# Patient Record
Sex: Male | Born: 1960 | Race: White | Hispanic: No | Marital: Married | Smoking: Former smoker
Health system: Southern US, Community
[De-identification: ages and names within clinical notes are randomized; demographics above are authoritative.]

## PROBLEM LIST (undated history)

## (undated) DIAGNOSIS — I10 Essential (primary) hypertension: Secondary | ICD-10-CM

## (undated) DIAGNOSIS — R011 Cardiac murmur, unspecified: Secondary | ICD-10-CM

## (undated) HISTORY — PX: TONSILLECTOMY: SUR1361

---

## 2005-07-21 ENCOUNTER — Emergency Department: Payer: Self-pay | Admitting: General Practice

## 2006-03-30 ENCOUNTER — Emergency Department: Payer: Self-pay | Admitting: Emergency Medicine

## 2006-10-10 ENCOUNTER — Emergency Department: Payer: Self-pay | Admitting: Emergency Medicine

## 2006-10-10 ENCOUNTER — Other Ambulatory Visit: Payer: Self-pay

## 2007-09-21 ENCOUNTER — Emergency Department: Payer: Self-pay | Admitting: Emergency Medicine

## 2007-09-21 ENCOUNTER — Other Ambulatory Visit: Payer: Self-pay

## 2008-07-13 ENCOUNTER — Emergency Department: Payer: Self-pay | Admitting: Emergency Medicine

## 2008-10-31 ENCOUNTER — Emergency Department: Payer: Self-pay | Admitting: Emergency Medicine

## 2009-06-13 ENCOUNTER — Emergency Department: Payer: Self-pay | Admitting: Internal Medicine

## 2010-09-08 ENCOUNTER — Emergency Department: Payer: Self-pay | Admitting: Emergency Medicine

## 2011-07-22 ENCOUNTER — Emergency Department: Payer: Self-pay | Admitting: Internal Medicine

## 2012-02-14 ENCOUNTER — Emergency Department: Payer: Self-pay | Admitting: Emergency Medicine

## 2012-10-19 ENCOUNTER — Emergency Department: Payer: Self-pay | Admitting: Emergency Medicine

## 2013-06-09 ENCOUNTER — Emergency Department: Payer: Self-pay | Admitting: Emergency Medicine

## 2013-06-12 LAB — BETA STREP CULTURE(ARMC)

## 2013-07-04 ENCOUNTER — Emergency Department: Payer: Self-pay | Admitting: Emergency Medicine

## 2013-07-04 LAB — COMPREHENSIVE METABOLIC PANEL
ALK PHOS: 81 U/L
Albumin: 4 g/dL (ref 3.4–5.0)
Anion Gap: 5 — ABNORMAL LOW (ref 7–16)
BILIRUBIN TOTAL: 0.3 mg/dL (ref 0.2–1.0)
BUN: 28 mg/dL — AB (ref 7–18)
CREATININE: 1.18 mg/dL (ref 0.60–1.30)
Calcium, Total: 8.8 mg/dL (ref 8.5–10.1)
Chloride: 107 mmol/L (ref 98–107)
Co2: 27 mmol/L (ref 21–32)
EGFR (Non-African Amer.): >60
Glucose: 95 mg/dL (ref 65–99)
OSMOLALITY: 283 (ref 275–301)
Potassium: 3.8 mmol/L (ref 3.5–5.1)
SGOT(AST): 36 U/L (ref 15–37)
SGPT (ALT): 39 U/L (ref 12–78)
SODIUM: 139 mmol/L (ref 136–145)
Total Protein: 7.6 g/dL (ref 6.4–8.2)

## 2013-07-04 LAB — CBC WITH DIFFERENTIAL/PLATELET
BASOS PCT: 0.4 %
Basophil #: 0 10*3/uL (ref 0.0–0.1)
Eosinophil #: 0.1 10*3/uL (ref 0.0–0.7)
Eosinophil %: 1.6 %
HCT: 39 % — AB (ref 40.0–52.0)
HGB: 13.4 g/dL (ref 13.0–18.0)
Lymphocyte #: 1.7 10*3/uL (ref 1.0–3.6)
Lymphocyte %: 29.6 %
MCH: 30.4 pg (ref 26.0–34.0)
MCHC: 34.4 g/dL (ref 32.0–36.0)
MCV: 89 fL (ref 80–100)
MONO ABS: 0.4 x10 3/mm (ref 0.2–1.0)
Monocyte %: 6.5 %
Neutrophil #: 3.5 10*3/uL (ref 1.4–6.5)
Neutrophil %: 61.9 %
Platelet: 170 10*3/uL (ref 150–440)
RBC: 4.41 10*6/uL (ref 4.40–5.90)
RDW: 13.9 % (ref 11.5–14.5)
WBC: 5.7 10*3/uL (ref 3.8–10.6)

## 2013-07-04 LAB — TROPONIN I: Troponin-I: <0.02 ng/mL

## 2013-07-04 LAB — D-DIMER(ARMC): D-Dimer: 236 ng/ml

## 2013-08-04 ENCOUNTER — Emergency Department: Payer: Self-pay | Admitting: Emergency Medicine

## 2013-09-04 ENCOUNTER — Emergency Department: Payer: Self-pay | Admitting: Emergency Medicine

## 2014-06-13 ENCOUNTER — Encounter: Payer: Self-pay | Admitting: Emergency Medicine

## 2014-06-13 ENCOUNTER — Emergency Department
Admission: EM | Admit: 2014-06-13 | Discharge: 2014-06-13 | Disposition: A | Discharge status: Home / Self Care | Payer: Self-pay | Attending: Student | Admitting: Student

## 2014-06-13 DIAGNOSIS — J0100 Acute maxillary sinusitis, unspecified: Secondary | ICD-10-CM | POA: Insufficient documentation

## 2014-06-13 DIAGNOSIS — I10 Essential (primary) hypertension: Secondary | ICD-10-CM | POA: Insufficient documentation

## 2014-06-13 DIAGNOSIS — Z79899 Other long term (current) drug therapy: Secondary | ICD-10-CM | POA: Insufficient documentation

## 2014-06-13 HISTORY — DX: Essential (primary) hypertension: I10

## 2014-06-13 MED ORDER — IBUPROFEN 800 MG PO TABS: 800.0000 mg | ORAL_TABLET | Freq: Three times a day (TID) | ORAL | Status: DC | PRN

## 2014-06-13 MED ORDER — AMOXICILLIN-POT CLAVULANATE 875-125 MG PO TABS: 1.0000 | ORAL_TABLET | Freq: Two times a day (BID) | ORAL | Status: AC

## 2014-06-13 NOTE — ED Notes (Signed)
Headache since Sunday  Sore throat and body aches this week

## 2014-06-13 NOTE — ED Notes (Signed)
C/o head sinus chest congestion, hurts to swallow past few days, back of throat slightly red

## 2014-06-13 NOTE — Discharge Instructions (Signed)
Take medications as prescribed. Rest. Drink plenty of fluids. Take over-the-counter Tylenol or ibuprofen as needed. Follow up with primary care physician next week or the above.  Return to the ER for new or worsening concerns.  Sinusitis Sinusitis is redness, soreness, and puffiness (inflammation) of the air pockets in the bones of your face (sinuses). The redness, soreness, and puffiness can cause air and mucus to get trapped in your sinuses. This can allow germs to grow and cause an infection.  HOME CARE   Drink enough fluids to keep your pee (urine) clear or pale yellow.  Use a humidifier in your home.  Run a hot shower to create steam in the bathroom. Sit in the bathroom with the door closed. Breathe in the steam 3-4 times a day.  Put a warm, moist washcloth on your face 3-4 times a day, or as told by your doctor.  Use salt water sprays (saline sprays) to wet the thick fluid in your nose. This can help the sinuses drain.  Only take medicine as told by your doctor. GET HELP RIGHT AWAY IF:   Your pain gets worse.  You have very bad headaches.  You are sick to your stomach (nauseous).  You throw up (vomit).  You are very sleepy (drowsy) all the time.  Your face is puffy (swollen).  Your vision changes.  You have a stiff neck.  You have trouble breathing. MAKE SURE YOU:   Understand these instructions.  Will watch your condition.  Will get help right away if you are not doing well or get worse. Document Released: 07/07/2007 Document Revised: 10/13/2011 Document Reviewed: 08/24/2011 [facility] Patient Information 2015 [employer] [place]. This information is not intended to replace advice given to you by your health care provider. Make sure you discuss any questions you have with your health care provider.

## 2014-06-13 NOTE — ED Provider Notes (Signed)
[facility] Emergency Department Provider Note  ____________________________________________  Time seen: Approximately [phone] AM  I have reviewed the triage vital signs and the nursing notes.   HISTORY  Chief Complaint Sore Throat   HPI Jaime Cook is a 54 y.o. male presents to the ER with complaints of sinus congestion and sinus pain. Patient reports congestion for one week. Patient reports increased sinus congestion and pain 5 days. Reports sore throat and throat redness 4 days. States throat feels raw and hurts to swallow. Denies chest pain or shortness of breath. Reports chills, nasal drainage, and cough but only at night.States sinus pain is 4 out of 10 described as pressure. States unrelieved with Tylenol.  Reports continues to eat and drink well. Past Medical History  Diagnosis Date  . Hypertension     There are no active problems to display for this patient.   History reviewed. No pertinent past surgical history.  Current Outpatient Rx  Name  Route  Sig  Dispense  Refill  . hydrochlorothiazide (HYDRODIURIL) 25 MG tablet   Oral   Take 25 mg by mouth daily.           Allergies Review of patient's allergies indicates no known allergies.  History reviewed. No pertinent family history.  Social History History  Substance Use Topics  . Smoking status: Never Smoker   . Smokeless tobacco: Not on file  . Alcohol Use: No    Review of Systems Constitutional:chills Eyes: No visual changes. ENT: Positive for congestion, sore throat, sinus pain as above. Cardiovascular: Denies chest pain. Respiratory: Positive for cough. Denies shortness of breath. Gastrointestinal: No abdominal pain.  No nausea, no vomiting.  No diarrhea.  No constipation. Genitourinary: Negative for dysuria. Musculoskeletal: Negative for back pain. Skin: Negative for rash. Neurological: Negative for headaches, focal weakness or numbness.  10-point ROS otherwise  negative.  ____________________________________________   PHYSICAL EXAM:  VITAL SIGNS: ED Triage Vitals  Enc Vitals Group     BP 06/13/14 0814 129/65 mmHg     Pulse Rate 06/13/14 0814 64     Resp 06/13/14 0814 18     Temp 06/13/14 0814 98 F (36.7 C)     Temp Source 06/13/14 0814 Oral     SpO2 06/13/14 0814 100 %     Weight 06/13/14 0814 220 lb (99.791 kg)     Height 06/13/14 0814 6' (1.829 m)     Head Cir --      Peak Flow --      Pain Score 06/13/14 0812 7     Pain Loc --      Pain Edu? --      Excl. in GC? --     Constitutional: Alert and oriented. Well appearing and in no acute distress. Eyes: Conjunctivae are normal. PERRL. EOMI. Head: Atraumatic. Moderate tender to palpation at maxillary sinus. Mild tender to palpation frontal sinus tenderness. No erythema, induration, or swelling. Nose:  nasal congestion with nasal mucosal inflammation. rhinorrhea Mouth/Throat: Mucous membranes are moist.  Mild-to-moderate pharyngeal erythema. No tonsillar swelling or exudate. Neck: No stridor. No cervical spine tenderness to palpation. No pain with range of motion. Hematological/Lymphatic/Immunilogical: No cervical lymphadenopathy. Cardiovascular: Normal rate, regular rhythm. Grossly normal heart sounds.  Good peripheral circulation. Respiratory: Normal respiratory effort.  No retractions. Lungs CTAB. Gastrointestinal: Soft and nontender. No distention. No abdominal bruits. No CVA tenderness. Musculoskeletal: No lower extremity tenderness nor edema.  No joint effusions. Neurologic:  Normal speech and language. No gross focal  neurologic deficits are appreciated. Speech is normal. No gait instability. Skin:  Skin is warm, dry and intact. No rash noted. Psychiatric: Mood and affect are normal. Speech and behavior are normal.   ________________________________________   INITIAL IMPRESSION / ASSESSMENT AND PLAN / ED COURSE  Pertinent labs & imaging results that were available during  my care of the patient were reviewed by me and considered in my medical decision making (see chart for details).  Very well-appearing. Patient reports to ER with sinus pain, congestion x 7 days with sore throat times approximate 4 days. Will treat patient with Augmentin and when necessary ibuprofen. Discussed supportive treatment. Discussed fluids and rest. Patient to follow up with primary care physician next week. Discussed follow-up and return parameters. Patient agreed to plan. ____________________________________________   FINAL CLINICAL IMPRESSION(S) / ED DIAGNOSES  Final diagnoses:  Acute maxillary sinusitis, recurrence not specified      Renford DillsLindsey Marlen Koman, NP 06/13/14 1033  Gayla DossEryka A Gayle, MD 06/13/14 1456

## 2014-08-19 ENCOUNTER — Emergency Department
Admission: EM | Admit: 2014-08-19 | Discharge: 2014-08-19 | Disposition: A | Discharge status: Home / Self Care | Payer: No Typology Code available for payment source | Attending: Emergency Medicine | Admitting: Emergency Medicine

## 2014-08-19 ENCOUNTER — Encounter: Payer: Self-pay | Admitting: Emergency Medicine

## 2014-08-19 ENCOUNTER — Emergency Department: Payer: No Typology Code available for payment source

## 2014-08-19 DIAGNOSIS — I10 Essential (primary) hypertension: Secondary | ICD-10-CM | POA: Diagnosis not present

## 2014-08-19 DIAGNOSIS — Y9389 Activity, other specified: Secondary | ICD-10-CM | POA: Insufficient documentation

## 2014-08-19 DIAGNOSIS — Y998 Other external cause status: Secondary | ICD-10-CM | POA: Diagnosis not present

## 2014-08-19 DIAGNOSIS — M47893 Other spondylosis, cervicothoracic region: Secondary | ICD-10-CM | POA: Insufficient documentation

## 2014-08-19 DIAGNOSIS — Z79899 Other long term (current) drug therapy: Secondary | ICD-10-CM | POA: Insufficient documentation

## 2014-08-19 DIAGNOSIS — Y92410 Unspecified street and highway as the place of occurrence of the external cause: Secondary | ICD-10-CM | POA: Insufficient documentation

## 2014-08-19 DIAGNOSIS — M5090 Cervical disc disorder, unspecified, unspecified cervical region: Secondary | ICD-10-CM

## 2014-08-19 DIAGNOSIS — S199XXA Unspecified injury of neck, initial encounter: Secondary | ICD-10-CM | POA: Diagnosis present

## 2014-08-19 DIAGNOSIS — S134XXA Sprain of ligaments of cervical spine, initial encounter: Secondary | ICD-10-CM | POA: Diagnosis not present

## 2014-08-19 MED ORDER — IBUPROFEN 800 MG PO TABS
800.0000 mg | ORAL_TABLET | Freq: Once | ORAL | Status: AC
  Administered 2014-08-19: 800 mg via ORAL

## 2014-08-19 MED ORDER — IBUPROFEN 800 MG PO TABS
ORAL_TABLET | ORAL | Status: AC
  Administered 2014-08-19: 800 mg via ORAL
  Filled 2014-08-19: qty 1

## 2014-08-19 MED ORDER — CYCLOBENZAPRINE HCL 10 MG PO TABS: 10.0000 mg | ORAL_TABLET | Freq: Three times a day (TID) | ORAL | Status: AC | PRN

## 2014-08-19 NOTE — Discharge Instructions (Signed)

## 2014-08-19 NOTE — ED Notes (Signed)
Dr. Manson PasseyBrown returns to bedside to speak with patient regarding results and discharge plan of care.
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
## 2014-08-19 NOTE — ED Notes (Signed)
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
## 2015-03-13 ENCOUNTER — Encounter: Payer: Self-pay | Admitting: Emergency Medicine

## 2015-03-13 ENCOUNTER — Emergency Department
Admission: EM | Admit: 2015-03-13 | Discharge: 2015-03-13 | Disposition: A | Discharge status: Home / Self Care | Payer: No Typology Code available for payment source | Attending: Emergency Medicine | Admitting: Emergency Medicine

## 2015-03-13 DIAGNOSIS — Z79899 Other long term (current) drug therapy: Secondary | ICD-10-CM | POA: Insufficient documentation

## 2015-03-13 DIAGNOSIS — H9203 Otalgia, bilateral: Secondary | ICD-10-CM | POA: Insufficient documentation

## 2015-03-13 DIAGNOSIS — J069 Acute upper respiratory infection, unspecified: Secondary | ICD-10-CM | POA: Insufficient documentation

## 2015-03-13 DIAGNOSIS — I10 Essential (primary) hypertension: Secondary | ICD-10-CM | POA: Insufficient documentation

## 2015-03-13 MED ORDER — GUAIFENESIN-CODEINE 100-10 MG/5ML PO SOLN: 5.0000 mL | ORAL | Status: DC | PRN

## 2015-03-13 NOTE — ED Notes (Signed)
States he developed sinus pressure  Bilateral ear pain and headache sine yesterday  Took some OTC meds with med relief last pm

## 2015-03-13 NOTE — ED Notes (Signed)
Pt reports of bilateral ear pain, headache and URI symptoms since yesterday.

## 2015-03-13 NOTE — Discharge Instructions (Signed)
Upper Respiratory Infection, Adult Most upper respiratory infections (URIs) are caused by a virus. A URI affects the nose, throat, and upper air passages. The most common type of URI is often called "the common cold." HOME CARE   Take medicines only as told by your doctor.  Gargle warm saltwater or take cough drops to comfort your throat as told by your doctor.  Use a warm mist humidifier or inhale steam from a shower to increase air moisture. This may make it easier to breathe.  Drink enough fluid to keep your pee (urine) clear or pale yellow.  Eat soups and other clear broths.  Have a healthy diet.  Rest as needed.  Go back to work when your fever is gone or your doctor says it is okay.  You may need to stay home longer to avoid giving your URI to others.  You can also wear a face mask and wash your hands often to prevent spread of the virus.  Use your inhaler more if you have asthma.  Do not use any tobacco products, including cigarettes, chewing tobacco, or electronic cigarettes. If you need help quitting, ask your doctor. GET HELP IF:  You are getting worse, not better.  Your symptoms are not helped by medicine.  You have chills.  You are getting more short of breath.  You have brown or red mucus.  You have yellow or brown discharge from your nose.  You have pain in your face, especially when you bend forward.  You have a fever.  You have puffy (swollen) neck glands.  You have pain while swallowing.  You have white areas in the back of your throat. GET HELP RIGHT AWAY IF:   You have very bad or constant:  Headache.  Ear pain.  Pain in your forehead, behind your eyes, and over your cheekbones (sinus pain).  Chest pain.  You have long-lasting (chronic) lung disease and any of the following:  Wheezing.  Long-lasting cough.  Coughing up blood.  A change in your usual mucus.  You have a stiff neck.  You have changes in  your:  Vision.  Hearing.  Thinking.  Mood. MAKE SURE YOU:   Understand these instructions.  Will watch your condition.  Will get help right away if you are not doing well or get worse.   This information is not intended to replace advice given to you by your health care provider. Make sure you discuss any questions you have with your health care provider.   Document Released: 07/07/2007 Document Revised: 06/04/2014 Document Reviewed: 04/25/2013 Elsevier Interactive Patient Education 2016 [employer]   Tylenol or ibuprofen as needed for headache or muscle aches. Robitussin-AC for cough and congestion. Be aware that this medication can make you drowsy so you should not be driving while taking this medication. Increase fluids.

## 2015-03-13 NOTE — ED Provider Notes (Signed)
[facility] Emergency Department Provider Note ____________________________________________  Time seen: Approximately 7:17 AM  I have reviewed the triage vital signs and the nursing notes.   HISTORY  Chief Complaint URI and Otalgia  HPI COPPER BASNETT is a 55 y.o. male is herewith complaint of sinus pressure and bilateral ear pain. Patient states that she has trace had a headache and nasal congestion. He has had some cough. He is taken Tylenol with minimal relief of symptoms. He is unaware of any fever but "felt chills". He denies any nausea, vomiting, or diarrhea. Patient is a nonsmoker. Currently rates his pain is 7 out of 10.   Past Medical History  Diagnosis Date  . Hypertension     There are no active problems to display for this patient.   History reviewed. No pertinent past surgical history.  Current Outpatient Rx  Name  Route  Sig  Dispense  Refill  . cyclobenzaprine (FLEXERIL) 10 MG tablet   Oral   Take 1 tablet (10 mg total) by mouth every 8 (eight) hours as needed for muscle spasms.   30 tablet   1   . guaiFENesin-codeine 100-10 MG/5ML syrup   Oral   Take 5 mLs by mouth every 4 (four) hours as needed for cough.   120 mL   0   . hydrochlorothiazide (HYDRODIURIL) 25 MG tablet   Oral   Take 25 mg by mouth daily.         Marland Kitchen ibuprofen (ADVIL,MOTRIN) 800 MG tablet   Oral   Take 1 tablet (800 mg total) by mouth every 8 (eight) hours as needed for mild pain or moderate pain.   15 tablet   0     Allergies Review of patient's allergies indicates no known allergies.  No family history on file.  Social History Social History  Substance Use Topics  . Smoking status: Never Smoker   . Smokeless tobacco: None  . Alcohol Use: No    Review of Systems Constitutional: No fever/possible chills Eyes: No visual changes. ENT: No sore throat. Positive nasal congestion Cardiovascular: Denies chest pain. Respiratory: Denies shortness of  breath. Positive nonproductive cough Gastrointestinal: No abdominal pain.  No nausea, no vomiting.  No diarrhea.  Skin: Negative for rash. Neurological: Positive for headaches, no focal weakness or numbness.  10-point ROS otherwise negative.  ____________________________________________   PHYSICAL EXAM:  VITAL SIGNS: ED Triage Vitals  Enc Vitals Group     BP 03/13/15 0709 138/70 mmHg     Pulse Rate 03/13/15 0709 81     Resp 03/13/15 0709 20     Temp 03/13/15 0709 97.7 F (36.5 C)     Temp Source 03/13/15 0709 Oral     SpO2 03/13/15 0709 97 %     Weight 03/13/15 0709 220 lb (99.791 kg)     Height 03/13/15 0709 6' (1.829 m)     Head Cir --      Peak Flow --      Pain Score 03/13/15 0709 7     Pain Loc --      Pain Edu? --      Excl. in GC? --     Constitutional: Alert and oriented. Well appearing and in no acute distress. Eyes: Conjunctivae are normal. PERRL. EOMI. Head: Atraumatic. Nose: Moderate congestion/rhinnorhea.  TMs are dull bilaterally with poor light reflex. Mouth/Throat: Mucous membranes are moist.  Oropharynx non-erythematous. Mild posterior drainage. Neck: No stridor.  Supple Hematological/Lymphatic/Immunilogical: No cervical lymphadenopathy. Cardiovascular: Normal  rate, regular rhythm. Grossly normal heart sounds.  Good peripheral circulation. Respiratory: Normal respiratory effort.  No retractions. Lungs CTAB. Musculoskeletal: His upper and lower extremities without any difficulty. Normal gait was noted. Neurologic:  Normal speech and language. No gross focal neurologic deficits are appreciated. No gait instability. Skin:  Skin is warm, dry and intact. No rash noted. Psychiatric: Mood and affect are normal. Speech and behavior are normal.  ____________________________________________   LABS (all labs ordered are listed, but only abnormal results are displayed)  Labs Reviewed - No data to display  PROCEDURES  Procedure(s) performed: None  Critical  Care performed: No  ____________________________________________   INITIAL IMPRESSION / ASSESSMENT AND PLAN / ED COURSE  Pertinent labs & imaging results that were available during my care of the patient were reviewed by me and considered in my medical decision making (see chart for details).  Patient was given a prescription for guaifenesin with codeine cough syrup. Patient is to continue with Tylenol or ibuprofen if needed for aches and encouraged drink plenty of fluids. 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 ____________________________________________   FINAL CLINICAL IMPRESSION(S) / ED DIAGNOSES  Final diagnoses:  Acute upper respiratory infection      Tommi Rumps, PA-C 03/13/15 1610  Sharyn Creamer, MD 03/13/15 [phone]

## 2015-08-28 ENCOUNTER — Emergency Department
Admission: EM | Admit: 2015-08-28 | Discharge: 2015-08-28 | Disposition: A | Discharge status: Home / Self Care | Payer: Self-pay | Attending: Emergency Medicine | Admitting: Emergency Medicine

## 2015-08-28 ENCOUNTER — Emergency Department: Payer: Self-pay

## 2015-08-28 ENCOUNTER — Encounter: Payer: Self-pay | Admitting: Emergency Medicine

## 2015-08-28 DIAGNOSIS — R51 Headache: Secondary | ICD-10-CM | POA: Insufficient documentation

## 2015-08-28 DIAGNOSIS — I10 Essential (primary) hypertension: Secondary | ICD-10-CM | POA: Insufficient documentation

## 2015-08-28 DIAGNOSIS — R700 Elevated erythrocyte sedimentation rate: Secondary | ICD-10-CM | POA: Insufficient documentation

## 2015-08-28 DIAGNOSIS — Z79899 Other long term (current) drug therapy: Secondary | ICD-10-CM | POA: Insufficient documentation

## 2015-08-28 DIAGNOSIS — R519 Headache, unspecified: Secondary | ICD-10-CM

## 2015-08-28 LAB — BASIC METABOLIC PANEL
ANION GAP: 8 (ref 5–15)
BUN: 27 mg/dL — AB (ref 6–20)
CALCIUM: 9.5 mg/dL (ref 8.9–10.3)
CO2: 27 mmol/L (ref 22–32)
Chloride: 104 mmol/L (ref 101–111)
Creatinine, Ser: 1.17 mg/dL (ref 0.61–1.24)
GFR calc Af Amer: >60 mL/min (ref >60)
Glucose, Bld: 102 mg/dL — ABNORMAL HIGH (ref 65–99)
POTASSIUM: 3.7 mmol/L (ref 3.5–5.1)
Sodium: 139 mmol/L (ref 135–145)

## 2015-08-28 LAB — CBC WITH DIFFERENTIAL/PLATELET
Basophils Absolute: 0 10*3/uL (ref 0–0.1)
Basophils Relative: 0 %
Eosinophils Absolute: 0.1 10*3/uL (ref 0–0.7)
Eosinophils Relative: 2 %
HEMATOCRIT: 40.3 % (ref 40.0–52.0)
Hemoglobin: 14.3 g/dL (ref 13.0–18.0)
Lymphocytes Relative: 33 %
Lymphs Abs: 1.9 10*3/uL (ref 1.0–3.6)
MCH: 30.9 pg (ref 26.0–34.0)
MCHC: 35.6 g/dL (ref 32.0–36.0)
MCV: 86.9 fL (ref 80.0–100.0)
Monocytes Absolute: 0.5 10*3/uL (ref 0.2–1.0)
Monocytes Relative: 8 %
Neutro Abs: 3.4 10*3/uL (ref 1.4–6.5)
Neutrophils Relative %: 57 %
Platelets: 166 10*3/uL (ref 150–440)
RBC: 4.64 MIL/uL (ref 4.40–5.90)
RDW: 13.5 % (ref 11.5–14.5)
WBC: 6 10*3/uL (ref 3.8–10.6)

## 2015-08-28 LAB — SEDIMENTATION RATE: Sed Rate: 21 mm/hr — ABNORMAL HIGH (ref 0–20)

## 2015-08-28 MED ORDER — KETOROLAC TROMETHAMINE 30 MG/ML IJ SOLN
30.0000 mg | Freq: Once | INTRAMUSCULAR | Status: AC
  Administered 2015-08-28: 30 mg via INTRAVENOUS
  Filled 2015-08-28: qty 1

## 2015-08-28 MED ORDER — DIPHENHYDRAMINE HCL 50 MG/ML IJ SOLN
25.0000 mg | Freq: Once | INTRAMUSCULAR | Status: AC
  Administered 2015-08-28: 25 mg via INTRAVENOUS
  Filled 2015-08-28: qty 1

## 2015-08-28 MED ORDER — PROCHLORPERAZINE EDISYLATE 5 MG/ML IJ SOLN
10.0000 mg | Freq: Once | INTRAMUSCULAR | Status: AC
  Administered 2015-08-28: 10 mg via INTRAVENOUS
  Filled 2015-08-28: qty 2

## 2015-08-28 MED ORDER — SODIUM CHLORIDE 0.9 % IV BOLUS (SEPSIS)
1000.0000 mL | Freq: Once | INTRAVENOUS | Status: AC
  Administered 2015-08-28: 1000 mL via INTRAVENOUS

## 2015-08-28 MED ORDER — AZITHROMYCIN 250 MG PO TABS: ORAL_TABLET | ORAL | 0 refills | Status: AC

## 2015-08-28 MED ORDER — FLUTICASONE PROPIONATE 50 MCG/ACT NA SUSP: 2.0000 | Freq: Every day | NASAL | 0 refills | Status: DC

## 2015-08-28 NOTE — ED Triage Notes (Signed)
Patient ambulatory to triage with steady gait, without difficulty or distress noted; pt reports generalized HA since last night accomp by nausea; denies hx of same

## 2015-08-28 NOTE — ED Notes (Signed)
Pt reports HA to left and right side of head near both temporal lobes feels like it is squeezing it off. Pt states started at 930pm. Took tylenol about 3am no relief. Pt has no sensitive to light but states eyes hurt.  Pt states has  Nausea but no vomiting. Pt denies any hx of headaches. Pt reports feels like ears are draining but doesn't see any draianage. Pt is alert and orient x4. Denies any SOB CP or Numbness. Will continue to monitor. MD made aware of pain. Pain is 7/10. Wife at the bedside.

## 2015-08-28 NOTE — ED Provider Notes (Signed)
[facility] Emergency Department Provider Note   ____________________________________________   First MD Initiated Contact with Patient 08/28/15 0715     (approximate)  I have reviewed the triage vital signs and the nursing notes.   HISTORY  Chief Complaint Headache    HPI Jaime Cook is a 55 y.o. male with a history of hypertension as well as sinus congestion and was presenting to the emergency department today with a bitemporal headache. He said it started at about 9:30 last night and was not thunderclap in origin. However, he says that his pain rose to about a 7 out of 10 and is now a 6 out of 10. He took Tylenol overnight which only provided minimal relief. He says he does not have a history of headache. Says that his pain makes his eyes hurt. Does not have any blurred vision. Says that it feels like something is draining in his ears and that he did have a runny nose for a period of time yesterday which is now resolved. No other people in the household have headaches currently. Patient denies any family history of cerebral aneurysm. Patient denies any neck pain and says he is able to range his neck fully.    Past Medical History:  Diagnosis Date  . Hypertension     There are no active problems to display for this patient.   History reviewed. No pertinent surgical history.  Prior to Admission medications   Medication Sig Start Date End Date Taking? Authorizing Provider  guaiFENesin-codeine 100-10 MG/5ML syrup Take 5 mLs by mouth every 4 (four) hours as needed for cough. 03/13/15   Johnn Hai, PA-C  hydrochlorothiazide (HYDRODIURIL) 25 MG tablet Take 25 mg by mouth daily.    Historical Provider, MD  ibuprofen (ADVIL,MOTRIN) 800 MG tablet Take 1 tablet (800 mg total) by mouth every 8 (eight) hours as needed for mild pain or moderate pain. 06/13/14   Marylene Land, NP    Allergies Review of patient's allergies indicates no known allergies.  No  family history on file.  Social History Social History  Substance Use Topics  . Smoking status: Never Smoker  . Smokeless tobacco: Never Used  . Alcohol use No    Review of Systems Constitutional: No fever/chills Eyes: No visual changes. ENT: No sore throat. Cardiovascular: Denies chest pain. Respiratory: Denies shortness of breath. Gastrointestinal: No abdominal pain.  No nausea, no vomiting.  No diarrhea.  No constipation. Genitourinary: Negative for dysuria. Musculoskeletal: Negative for back pain. Skin: Negative for rash. Neurological: Negative for focal weakness or numbness.  10-point ROS otherwise negative.  ____________________________________________   PHYSICAL EXAM:  VITAL SIGNS: ED Triage Vitals  Enc Vitals Group     BP 08/28/15 0614 (!) 143/82     Pulse Rate 08/28/15 0614 64     Resp 08/28/15 0614 18     Temp 08/28/15 0614 97.6 F (36.4 C)     Temp Source 08/28/15 0614 Oral     SpO2 08/28/15 0614 95 %     Weight 08/28/15 0611 220 lb (99.8 kg)     Height 08/28/15 0611 6' (1.829 m)     Head Circumference --      Peak Flow --      Pain Score 08/28/15 0610 7     Pain Loc --      Pain Edu? --      Excl. in [place]? --     Constitutional: Alert and oriented. Well appearing and in  no acute distress. Eyes: Conjunctivae are normal. PERRL. EOMI. Head: Atraumatic.No tenderness along the distribution of the bilateral temporal arteries. There is also no nodularity along the bilateral temporal arteries. Bilateral tympanic membranes appear bulging but clear. Nose: Severe congestion bilaterally with severely swollen turbinates to the right side. Mouth/Throat: Mucous membranes are moist.   Neck: No stridor.   Cardiovascular: Normal rate, regular rhythm. Grossly normal heart sounds.   Respiratory: Normal respiratory effort.  No retractions. Lungs CTAB. Gastrointestinal: Soft and nontender. No distention.  Musculoskeletal: No lower extremity tenderness nor edema.  No  joint effusions. Neurologic:  Normal speech and language. No gross focal neurologic deficits are appreciated.  Skin:  Skin is warm, dry and intact. No rash noted. Psychiatric: Mood and affect are normal. Speech and behavior are normal.  ____________________________________________   LABS (all labs ordered are listed, but only abnormal results are displayed)  Labs Reviewed  BASIC METABOLIC PANEL - Abnormal; Notable for the following:       Result Value   Glucose, Bld 102 (*)    BUN 27 (*)    All other components within normal limits  SEDIMENTATION RATE - Abnormal; Notable for the following:    Sed Rate 21 (*)    All other components within normal limits  CBC WITH DIFFERENTIAL/PLATELET   ____________________________________________  EKG   ____________________________________________  RADIOLOGY  Study Result   CLINICAL DATA:  Bilateral temporal headache and pressure sensation. No previous history of severe headaches. EXAM: CT HEAD WITHOUT CONTRAST TECHNIQUE: Contiguous axial images were obtained from the base of the skull through the vertex without intravenous contrast. COMPARISON:  None. FINDINGS: The brain has a normal appearance without evidence of malformation, atrophy, old or acute infarction, mass lesion, hemorrhage, hydrocephalus or extra-axial collection. The calvarium is unremarkable. The paranasal sinuses, middle ears and mastoids are clear. IMPRESSION: Normal head CT Electronically Signed   By: Nelson Chimes M.D.   On: 08/28/2015 07:50    ____________________________________________   PROCEDURES   Procedures   ____________________________________________   INITIAL IMPRESSION / ASSESSMENT AND PLAN / ED COURSE  Pertinent labs & imaging results that were available during my care of the patient were reviewed by me and considered in my medical decision making (see chart for details).  ----------------------------------------- 10:03 AM on  08/28/2015 -----------------------------------------  Patient is resting comfortably at this time and is headache free after medication. He had a slightly elevated ESR. However, with the symptoms completely relieved and also his sinus complaints of bleed this is more sinus/eustachian tube related. It is much less likely to be giant cell arteritis. The patient says that he has also previously been on antibiotics for sinus infection. I'll be prescribing him a Z-Pak as well as Flonase. I will also give him Unasyn throat follow-up. Will be discharged home.  Clinical Course     ____________________________________________   FINAL CLINICAL IMPRESSION(S) / ED DIAGNOSES  Headache, resolved.    NEW MEDICATIONS STARTED DURING THIS VISIT:  New Prescriptions   No medications on file     Note:  This document was prepared using Dragon voice recognition software and may include unintentional dictation errors.    Orbie Pyo, MD 08/28/15 1004

## 2016-03-30 ENCOUNTER — Ambulatory Visit
Admission: EM | Admit: 2016-03-30 | Discharge: 2016-03-30 | Disposition: A | Discharge status: Home / Self Care | Payer: Self-pay | Attending: Emergency Medicine | Admitting: Emergency Medicine

## 2016-03-30 ENCOUNTER — Encounter: Payer: Self-pay | Admitting: *Deleted

## 2016-03-30 DIAGNOSIS — K047 Periapical abscess without sinus: Secondary | ICD-10-CM

## 2016-03-30 HISTORY — DX: Cardiac murmur, unspecified: R01.1

## 2016-03-30 MED ORDER — HYDROCODONE-ACETAMINOPHEN 5-325 MG PO TABS: 2.0000 | ORAL_TABLET | Freq: Four times a day (QID) | ORAL | 0 refills | Status: DC | PRN

## 2016-03-30 MED ORDER — AMOXICILLIN-POT CLAVULANATE 875-125 MG PO TABS: 1.0000 | ORAL_TABLET | Freq: Two times a day (BID) | ORAL | 0 refills | Status: DC

## 2016-03-30 NOTE — ED Provider Notes (Signed)
CSN: [id number]     Arrival date & time 03/30/16  1859 History   First MD Initiated Contact with Patient 03/30/16 1928     Chief Complaint  Patient presents with  . Dental Pain   (Consider location/radiation/quality/duration/timing/severity/associated sxs/prior Treatment) HPI  This a 56 year old male who is accompanied by his wife presents with right lower dental pain. He states that this woke him from sleep at approximately 2 AM. Has persisted all day. He said no fever or chills. His face is swollen on the right lower aspect and is very tender sore gums. He has dentition in very poor repair. He has had all of his teeth extracted on the uppers but has the lowers remaining all of which are filled with caries and fractures.[place] and his profile is clean.      Past Medical History:  Diagnosis Date  . Heart murmur   . Hypertension    Past Surgical History:  Procedure Laterality Date  . TONSILLECTOMY     Family History  Problem Relation Age of Onset  . Hypertension Mother   . Heart failure Mother   . Hypertension Father   . Heart failure Father   . Cancer Father    Social History  Substance Use Topics  . Smoking status: Never Smoker  . Smokeless tobacco: Never Used  . Alcohol use Not on file    Review of Systems  Constitutional: Positive for activity change. Negative for appetite change, chills, fatigue and fever.  HENT: Positive for dental problem.   All other systems reviewed and are negative.   Allergies  Patient has no known allergies.  Home Medications   Prior to Admission medications   Medication Sig Start Date End Date Taking? Authorizing Provider  ibuprofen (ADVIL,MOTRIN) 800 MG tablet Take 1 tablet (800 mg total) by mouth every 8 (eight) hours as needed for mild pain or moderate pain. 06/13/14  Yes Renford DillsLindsey Miller, NP  amoxicillin-clavulanate (AUGMENTIN) 875-125 MG tablet Take 1 tablet by mouth every 12 (twelve) hours. 03/30/16    Lutricia FeilWilliam P Marshayla Mitschke, PA-C  fluticasone (FLONASE) 50 MCG/ACT nasal spray Place 2 sprays into both nostrils daily. 08/28/15 08/27/16  Myrna Blazeravid Matthew Schaevitz, MD  guaiFENesin-codeine 100-10 MG/5ML syrup Take 5 mLs by mouth every 4 (four) hours as needed for cough. 03/13/15   Tommi Rumpshonda L Summers, PA-C  hydrochlorothiazide (HYDRODIURIL) 25 MG tablet Take 25 mg by mouth daily.    Historical Provider, MD  HYDROcodone-acetaminophen (NORCO/VICODIN) 5-325 MG tablet Take 2 tablets by mouth every 6 (six) hours as needed. 03/30/16   Lutricia FeilWilliam P Tisa Weisel, PA-C   Meds Ordered and Administered this Visit  Medications - No data to display  BP (!) 158/84 (BP Location: Left Arm)   Pulse 62   Temp 97.8 F (36.6 C) (Oral)   Resp 16   Ht 6' (1.829 m)   Wt 220 lb (99.8 kg)   SpO2 97%   BMI 29.84 kg/m  No data found.   Physical Exam  Constitutional: He is oriented to person, place, and time. He appears well-developed and well-nourished. No distress.  HENT:  Head: Normocephalic and atraumatic.  Examination of his dentition shows him to be edentulous on the upper. Patient has few teeth remaining on the lowers. Those that do remain to have performed with the carriers. His right lower molar has a and caries. The gum is swollen red and extremely tender. He does have swelling of his cheek on the right in the lower. There is  no adenopathy appreciated. His right TM is dark and dull.  Eyes: Pupils are equal, round, and reactive to light. Right eye exhibits no discharge. Left eye exhibits no discharge.  Neck: Normal range of motion. Neck supple.  Cardiovascular: Normal rate and regular rhythm.  Exam reveals no gallop and no friction rub.   Murmur heard. Musculoskeletal: Normal range of motion.  Lymphadenopathy:    He has no cervical adenopathy.  Neurological: He is alert and oriented to person, place, and time.  Skin: Skin is warm and dry. He is not diaphoretic.  Psychiatric: He has a normal mood and affect. His behavior is  normal. Judgment and thought content normal.  Nursing note and vitals reviewed.   Urgent Care Course     Procedures (including critical care time)  Labs Review Labs Reviewed - No data to display  Imaging Review No results found.   Visual Acuity Review  Right Eye Distance:   Left Eye Distance:   Bilateral Distance:    Right Eye Near:   Left Eye Near:    Bilateral Near:         MDM   1. Dental abscess    Discharge Medication List as of 03/30/2016  7:50 PM    START taking these medications   Details  amoxicillin-clavulanate (AUGMENTIN) 875-125 MG tablet Take 1 tablet by mouth every 12 (twelve) hours., Starting Tue 03/30/2016, Print    HYDROcodone-acetaminophen (NORCO/VICODIN) 5-325 MG tablet Take 2 tablets by mouth every 6 (six) hours as needed., Starting Tue 03/30/2016, Print      Plan: 1. Test/x-ray results and diagnosis reviewed with patient 2. rx as per orders; risks, benefits, potential side effects reviewed with patient 3. Recommend supportive treatment with Use of antibiotics for several days and then arranged to see a dentist. Use Vicodin for nighttime pain and rely on Tylenol and Motrin combination during daytime. 4. F/u prn if symptoms worsen or don't improve     Lutricia Feil, PA-C 03/30/16 2016

## 2016-03-30 NOTE — ED Triage Notes (Signed)
Pt awoke with right lower dental pain this am approx 0200. Jaime Cook has been persistent all day.

## 2016-11-01 ENCOUNTER — Encounter: Payer: Self-pay | Admitting: Emergency Medicine

## 2016-11-01 ENCOUNTER — Emergency Department
Admission: EM | Admit: 2016-11-01 | Discharge: 2016-11-01 | Disposition: A | Discharge status: Home / Self Care | Payer: Self-pay | Attending: Emergency Medicine | Admitting: Emergency Medicine

## 2016-11-01 DIAGNOSIS — K029 Dental caries, unspecified: Secondary | ICD-10-CM | POA: Insufficient documentation

## 2016-11-01 DIAGNOSIS — Z79899 Other long term (current) drug therapy: Secondary | ICD-10-CM | POA: Insufficient documentation

## 2016-11-01 DIAGNOSIS — I10 Essential (primary) hypertension: Secondary | ICD-10-CM | POA: Insufficient documentation

## 2016-11-01 MED ORDER — AMOXICILLIN 500 MG PO CAPS: 500.0000 mg | ORAL_CAPSULE | Freq: Three times a day (TID) | ORAL | 0 refills | Status: DC

## 2016-11-01 MED ORDER — LIDOCAINE VISCOUS 2 % MT SOLN: 5.0000 mL | Freq: Four times a day (QID) | OROMUCOSAL | 0 refills | Status: DC | PRN

## 2016-11-01 MED ORDER — OXYCODONE-ACETAMINOPHEN 5-325 MG PO TABS: 1.0000 | ORAL_TABLET | Freq: Four times a day (QID) | ORAL | 0 refills | Status: DC | PRN

## 2016-11-01 MED ORDER — IBUPROFEN 600 MG PO TABS: 600.0000 mg | ORAL_TABLET | Freq: Four times a day (QID) | ORAL | 0 refills | Status: DC | PRN

## 2016-11-01 NOTE — ED Provider Notes (Signed)
[facility] Emergency Department Provider Note   ____________________________________________   None    (approximate)  I have reviewed the triage vital signs and the nursing notes.   HISTORY  Chief Complaint Dental Pain    HPI Jaime Cook is a 56 y.o. male patient presents with 3 days of dental pain. Patient has some mild facial edema right lower mandible. Patient has very poor dentition. Lower tooth filled cavities and multiple fractures. Patient state he is being followed by  [facility] dental clinic.Patient rates his pain as a 10 over 10. Patient described a pain as "throbbing". No palliative measures for complaint. Review of the narcotic registry shows no abuse. Patient blood pressure elevated today but him this did not take his morning medication.   Past Medical History:  Diagnosis Date  . Heart murmur   . Hypertension     There are no active problems to display for this patient.   Past Surgical History:  Procedure Laterality Date  . TONSILLECTOMY      Prior to Admission medications   Medication Sig Start Date End Date Taking? Authorizing Provider  amoxicillin (AMOXIL) 500 MG capsule Take 1 capsule (500 mg total) by mouth 3 (three) times daily. 11/01/16   Joni Reining, PA-C  amoxicillin-clavulanate (AUGMENTIN) 875-125 MG tablet Take 1 tablet by mouth every 12 (twelve) hours. 03/30/16   Lutricia Feil, PA-C  fluticasone (FLONASE) 50 MCG/ACT nasal spray Place 2 sprays into both nostrils daily. 08/28/15 08/27/16  Myrna Blazer, MD  guaiFENesin-codeine 100-10 MG/5ML syrup Take 5 mLs by mouth every 4 (four) hours as needed for cough. 03/13/15   Tommi Rumps, PA-C  hydrochlorothiazide (HYDRODIURIL) 25 MG tablet Take 25 mg by mouth daily.    [provider]  HYDROcodone-acetaminophen (NORCO/VICODIN) 5-325 MG tablet Take 2 tablets by mouth every 6 (six) hours as needed. 03/30/16   Lutricia Feil, PA-C  ibuprofen  (ADVIL,MOTRIN) 600 MG tablet Take 1 tablet (600 mg total) by mouth every 6 (six) hours as needed. 11/01/16   Joni Reining, PA-C  ibuprofen (ADVIL,MOTRIN) 800 MG tablet Take 1 tablet (800 mg total) by mouth every 8 (eight) hours as needed for mild pain or moderate pain. 06/13/14   Renford Dills, NP  lidocaine (XYLOCAINE) 2 % solution Use as directed 5 mLs in the mouth or throat every 6 (six) hours as needed for mouth pain. 11/01/16   Joni Reining, PA-C  oxyCODONE-acetaminophen (ROXICET) 5-325 MG tablet Take 1 tablet by mouth every 6 (six) hours as needed for moderate pain. 11/01/16   Joni Reining, PA-C    Allergies Patient has no known allergies.  Family History  Problem Relation Age of Onset  . Hypertension Mother   . Heart failure Mother   . Hypertension Father   . Heart failure Father   . Cancer Father     Social History Social History  Substance Use Topics  . Smoking status: Never Smoker  . Smokeless tobacco: Never Used  . Alcohol use Not on file    Review of Systems Constitutional: No fever/chills Eyes: No visual changes. ENT: No sore throat.Dental pain Cardiovascular: Denies chest pain. Respiratory: Denies shortness of breath. Gastrointestinal: No abdominal pain.  No nausea, no vomiting.  No diarrhea.  No constipation. Genitourinary: Negative for dysuria. Musculoskeletal: Negative for back pain. Skin: Negative for rash. Neurological: Negative for headaches, focal weakness or numbness. Endocrine:Hypertension ____________________________________________   PHYSICAL EXAM:  [facility]  Enc Vitals Group     BP 11/01/16 1142 (!) 180/77     Pulse Rate 11/01/16 1142 63     Resp 11/01/16 1142 16     Temp 11/01/16 1142 98.1 F (36.7 C)     Temp Source 11/01/16 1142 Oral     SpO2 11/01/16 1142 97 %     Weight 11/01/16 1143 220 lb (99.8 kg)     Height 11/01/16 1143 6' (1.829 m)     Head Circumference --      Peak Flow --      Pain Score  11/01/16 1152 10     Pain Loc --      Pain Edu? --      Excl. in [place]? --    Constitutional: Alert and oriented. Well appearing and in no acute distress. Eyes: Conjunctivae are normal. PERRL. EOMI. Head: Atraumatic. Nose: No congestion/rhinnorhea. Mouth/Throat: Mucous membranes are moist.  Oropharynx non-erythematous. Multiple caries and devitalized teeth lower mandible area. Edematous gingiva right lower mandible. Neck: No stridor.   Hematological/Lymphatic/Immunilogical: No cervical lymphadenopathy. Cardiovascular: Normal rate, regular rhythm. Grossly normal heart sounds.  Good peripheral circulation. Respiratory: Normal respiratory effort.  No retractions. Lungs CTAB. Skin:  Skin is warm, dry and intact. No rash noted. Psychiatric: Mood and affect are normal. Speech and behavior are normal.  ____________________________________________   LABS (all labs ordered are listed, but only abnormal results are displayed)  Labs Reviewed - No data to display ____________________________________________  EKG   ____________________________________________  RADIOLOGY  No results found.  ____________________________________________   PROCEDURES  Procedure(s) performed: None  Procedures  Critical Care performed: No  ____________________________________________   INITIAL IMPRESSION / ASSESSMENT AND PLAN / ED COURSE  Pertinent labs & imaging results that were available during my care of the patient were reviewed by me and considered in my medical decision making (see chart for details).  Dental pain secondary to multiple caries and fractures of the lower mandible. Advised patient to schedule follow-up [facility]. Patient given discharge care instructions. Patient advised take medication as directed.      ____________________________________________   FINAL CLINICAL IMPRESSION(S) / ED DIAGNOSES  Final diagnoses:  Pain due to dental caries      NEW  MEDICATIONS STARTED DURING THIS VISIT:  New Prescriptions   AMOXICILLIN (AMOXIL) 500 MG CAPSULE    Take 1 capsule (500 mg total) by mouth 3 (three) times daily.   IBUPROFEN (ADVIL,MOTRIN) 600 MG TABLET    Take 1 tablet (600 mg total) by mouth every 6 (six) hours as needed.   LIDOCAINE (XYLOCAINE) 2 % SOLUTION    Use as directed 5 mLs in the mouth or throat every 6 (six) hours as needed for mouth pain.   OXYCODONE-ACETAMINOPHEN (ROXICET) 5-325 MG TABLET    Take 1 tablet by mouth every 6 (six) hours as needed for moderate pain.     Note:  This document was prepared using Dragon voice recognition software and may include unintentional dictation errors.    Joni Reining, PA-C 11/01/16 1338    Nita Sickle, MD 11/01/16 [phone]

## 2016-11-01 NOTE — ED Notes (Signed)
See triage note  Presents with right side jaw pain for couple of days

## 2016-11-01 NOTE — ED Triage Notes (Signed)
C/O right lower jaw / dental pain x 2 days. 

## 2017-11-03 ENCOUNTER — Emergency Department
Admission: EM | Admit: 2017-11-03 | Discharge: 2017-11-03 | Disposition: A | Discharge status: Home / Self Care | Payer: Self-pay | Attending: Emergency Medicine | Admitting: Emergency Medicine

## 2017-11-03 ENCOUNTER — Emergency Department: Payer: Self-pay

## 2017-11-03 ENCOUNTER — Other Ambulatory Visit: Payer: Self-pay

## 2017-11-03 ENCOUNTER — Encounter: Payer: Self-pay | Admitting: Emergency Medicine

## 2017-11-03 DIAGNOSIS — R51 Headache: Secondary | ICD-10-CM | POA: Insufficient documentation

## 2017-11-03 DIAGNOSIS — M5412 Radiculopathy, cervical region: Secondary | ICD-10-CM | POA: Insufficient documentation

## 2017-11-03 DIAGNOSIS — I10 Essential (primary) hypertension: Secondary | ICD-10-CM | POA: Insufficient documentation

## 2017-11-03 DIAGNOSIS — R519 Headache, unspecified: Secondary | ICD-10-CM

## 2017-11-03 LAB — CBC WITH DIFFERENTIAL/PLATELET
Band Neutrophils: 0 %
Basophils Absolute: 0 10*3/uL (ref 0–0.1)
Basophils Relative: 0 %
Blasts: 0 %
EOS PCT: 1 %
Eosinophils Absolute: 0.1 10*3/uL (ref 0–0.7)
HEMATOCRIT: 39.9 % — AB (ref 40.0–52.0)
HEMOGLOBIN: 14.4 g/dL (ref 13.0–18.0)
LYMPHS ABS: 1.8 10*3/uL (ref 1.0–3.6)
LYMPHS PCT: 34 %
MCH: 30.7 pg (ref 26.0–34.0)
MCHC: 36 g/dL (ref 32.0–36.0)
MCV: 85.1 fL (ref 80.0–100.0)
MYELOCYTES: 0 %
Metamyelocytes Relative: 0 %
Monocytes Absolute: 0.7 10*3/uL (ref 0.2–1.0)
Monocytes Relative: 14 %
NEUTROS PCT: 51 %
NRBC: 0 /100{WBCs}
Neutro Abs: 2.6 10*3/uL (ref 1.4–6.5)
Other: 0 %
PROMYELOCYTES RELATIVE: 0 %
Platelets: 143 10*3/uL — ABNORMAL LOW (ref 150–440)
RBC: 4.69 MIL/uL (ref 4.40–5.90)
RDW: 14.4 % (ref 11.5–14.5)
WBC: 5.2 10*3/uL (ref 3.8–10.6)

## 2017-11-03 LAB — INFLUENZA PANEL BY PCR (TYPE A & B)
INFLBPCR: NEGATIVE
Influenza A By PCR: NEGATIVE

## 2017-11-03 LAB — URINALYSIS, COMPLETE (UACMP) WITH MICROSCOPIC
BACTERIA UA: NONE SEEN
Bilirubin Urine: NEGATIVE
Glucose, UA: NEGATIVE mg/dL
Hgb urine dipstick: NEGATIVE
Ketones, ur: NEGATIVE mg/dL
Leukocytes, UA: NEGATIVE
Nitrite: NEGATIVE
PROTEIN: NEGATIVE mg/dL
Specific Gravity, Urine: 1.008 (ref 1.005–1.030)
Squamous Epithelial / LPF: NONE SEEN (ref 0–5)
pH: 6 (ref 5.0–8.0)

## 2017-11-03 LAB — COMPREHENSIVE METABOLIC PANEL
ALT: 27 U/L (ref 0–44)
AST: 28 U/L (ref 15–41)
Albumin: 3.8 g/dL (ref 3.5–5.0)
Alkaline Phosphatase: 54 U/L (ref 38–126)
Anion gap: 8 (ref 5–15)
BUN: 14 mg/dL (ref 6–20)
CHLORIDE: 109 mmol/L (ref 98–111)
CO2: 23 mmol/L (ref 22–32)
CREATININE: 1.03 mg/dL (ref 0.61–1.24)
Calcium: 8.5 mg/dL — ABNORMAL LOW (ref 8.9–10.3)
GFR calc Af Amer: >60 mL/min (ref >60)
Glucose, Bld: 100 mg/dL — ABNORMAL HIGH (ref 70–99)
POTASSIUM: 4 mmol/L (ref 3.5–5.1)
Sodium: 140 mmol/L (ref 135–145)
Total Bilirubin: 0.7 mg/dL (ref 0.3–1.2)
Total Protein: 6.9 g/dL (ref 6.5–8.1)

## 2017-11-03 LAB — LACTIC ACID, PLASMA: LACTIC ACID, VENOUS: 1.4 mmol/L (ref 0.5–1.9)

## 2017-11-03 LAB — SEDIMENTATION RATE: SED RATE: 22 mm/h — AB (ref 0–20)

## 2017-11-03 LAB — TROPONIN I

## 2017-11-03 MED ORDER — SODIUM CHLORIDE 0.9 % IV SOLN
1000.0000 mL | Freq: Once | INTRAVENOUS | Status: AC
  Administered 2017-11-03: 1000 mL via INTRAVENOUS

## 2017-11-03 MED ORDER — PREDNISONE 10 MG (21) PO TBPK: ORAL_TABLET | ORAL | 0 refills | Status: DC

## 2017-11-03 MED ORDER — SODIUM CHLORIDE 0.9 % IV SOLN
Freq: Once | INTRAVENOUS | Status: AC
  Administered 2017-11-03: 08:00:00 via INTRAVENOUS

## 2017-11-03 MED ORDER — BUTALBITAL-APAP-CAFFEINE 50-325-40 MG PO TABS: 1.0000 | ORAL_TABLET | Freq: Four times a day (QID) | ORAL | 0 refills | Status: DC | PRN

## 2017-11-03 MED ORDER — METOCLOPRAMIDE HCL 5 MG/ML IJ SOLN
10.0000 mg | Freq: Once | INTRAMUSCULAR | Status: AC
  Administered 2017-11-03: 10 mg via INTRAVENOUS
  Filled 2017-11-03: qty 2

## 2017-11-03 NOTE — ED Provider Notes (Signed)
[facility] Emergency Department Provider Note       Time seen: ----------------------------------------- 7:39 AM on 11/03/2017 -----------------------------------------   I have reviewed the triage vital signs and the nursing notes.  HISTORY   Chief Complaint Headache    HPI Jaime Cook is a 57 y.o. male with a history of heart murmur and hypertension who presents to the ED for complaints of headache and neck and shoulder pain.  Patient describes pain on both sides of his head and forehead.  He has pain that radiates into his neck and shoulders accompanied by nausea and diaphoresis.  He has had possible fever.  He denies any recent tick bite, denies congestion cough, vomiting or diarrhea.  Past Medical History:  Diagnosis Date  . Heart murmur   . Hypertension     There are no active problems to display for this patient.   Past Surgical History:  Procedure Laterality Date  . TONSILLECTOMY      Allergies Patient has no known allergies.  Social History Social History   Tobacco Use  . Smoking status: Never Smoker  . Smokeless tobacco: Never Used  Substance Use Topics  . Alcohol use: Not on file  . Drug use: Not on file   Review of Systems Constitutional: Positive for fever Eyes: Negative for vision changes ENT:  Negative for congestion, sore throat Cardiovascular: Negative for chest pain. Respiratory: Negative for shortness of breath. Gastrointestinal: Negative for abdominal pain, vomiting and diarrhea. Musculoskeletal: Positive for radicular pain down both arms Skin: Negative for rash. Neurological: Positive for headache  All systems negative/normal/unremarkable except as stated in the HPI  ____________________________________________   PHYSICAL EXAM:  VITAL SIGNS: ED Triage Vitals  Enc Vitals Group     BP 11/03/17 0710 126/64     Pulse Rate 11/03/17 0710 69     Resp --      Temp 11/03/17 0710 97.7 F (36.5 C)     Temp  Source 11/03/17 0710 Oral     SpO2 11/03/17 0710 97 %     Weight 11/03/17 0702 220 lb (99.8 kg)     Height 11/03/17 0702 6' (1.829 m)     Head Circumference --      Peak Flow --      Pain Score 11/03/17 0702 7     Pain Loc --      Pain Edu? --      Excl. in GC? --    Constitutional: Alert and oriented. Well appearing and in no distress. Eyes: Conjunctivae are normal. Normal extraocular movements. ENT   Head: Normocephalic and atraumatic.  No photophobia   Nose: No congestion/rhinnorhea.   Mouth/Throat: Mucous membranes are moist.   Neck: No stridor.  No meningeal signs are noted Cardiovascular: Normal rate, regular rhythm. No murmurs, rubs, or gallops. Respiratory: Normal respiratory effort without tachypnea nor retractions. Breath sounds are clear and equal bilaterally. No wheezes/rales/rhonchi. Gastrointestinal: Soft and nontender. Normal bowel sounds Musculoskeletal: Nontender with normal range of motion in extremities. No lower extremity tenderness nor edema. Neurologic:  Normal speech and language. No gross focal neurologic deficits are appreciated.  Skin:  Skin is warm, with mild diaphoresis Psychiatric: Mood and affect are normal. Speech and behavior are normal.   ____________________________________________  ED COURSE:  As part of my medical decision making, I reviewed the following data within the electronic MEDICAL RECORD NUMBER History obtained from family if available, nursing notes, old chart and ekg, as well as notes from prior ED visits.  Patient presented for headache with radicular pain, we will assess with labs and imaging as indicated at this time.   Procedures ____________________________________________   LABS (pertinent positives/negatives)  Labs Reviewed  CBC WITH DIFFERENTIAL/PLATELET - Abnormal; Notable for the following components:      Result Value   HCT 39.9 (*)    Platelets 143 (*)    All other components within normal limits  URINALYSIS,  COMPLETE (UACMP) WITH MICROSCOPIC - Abnormal; Notable for the following components:   Color, Urine STRAW (*)    APPearance CLEAR (*)    All other components within normal limits  SEDIMENTATION RATE - Abnormal; Notable for the following components:   Sed Rate 22 (*)    All other components within normal limits  COMPREHENSIVE METABOLIC PANEL - Abnormal; Notable for the following components:   Glucose, Bld 100 (*)    Calcium 8.5 (*)    All other components within normal limits  CULTURE, BLOOD (ROUTINE X 2)  CULTURE, BLOOD (ROUTINE X 2)  LACTIC ACID, PLASMA  INFLUENZA PANEL BY PCR (TYPE A & B)  TROPONIN I  CBG MONITORING, ED   EKG: Interpreted by me, sinus rhythm rate of 50 bpm, normal PR interval, normal QRS, normal QT  RADIOLOGY CT head  IMPRESSION: No acute findings.  Normal brain  ____________________________________________  DIFFERENTIAL DIAGNOSIS   Viral syndrome, tension headache, dehydration, viral meningitis, cervical radiculopathy, sepsis  FINAL ASSESSMENT AND PLAN  Headache, cervical radiculopathy   Plan: The patient had presented for headache and radicular arm pain. Patient's labs did not reveal any acute process, sed rate is trace elevated but I doubt this is of significance. Patient's imaging was normal.  He is feeling better after fluids and Reglan.  This is likely viral with a component of degenerative disc disease.  He has no meningeal signs.  Advise follow-up in 2 days for recheck.   Ulice Dash, MD   Note: This note was generated in part or whole with voice recognition software. Voice recognition is usually quite accurate but there are transcription errors that can and very often do occur. I apologize for any typographical errors that were not detected and corrected.     Emily Filbert, MD 11/03/17 1044

## 2017-11-03 NOTE — ED Triage Notes (Addendum)
Patient ambulatory to triage with steady gait, without difficulty or distress noted; pt reports HA to sides of head and forehead radiating into neck & shoulders accomp by nausea since yesterday; st had low grade temp as well; mask placed on pt

## 2017-11-08 LAB — CULTURE, BLOOD (ROUTINE X 2)
CULTURE: NO GROWTH
Culture: NO GROWTH

## 2017-11-23 ENCOUNTER — Emergency Department: Payer: Self-pay

## 2017-11-23 ENCOUNTER — Other Ambulatory Visit: Payer: Self-pay

## 2017-11-23 ENCOUNTER — Emergency Department
Admission: EM | Admit: 2017-11-23 | Discharge: 2017-11-23 | Disposition: A | Discharge status: Home / Self Care | Payer: Self-pay | Attending: Emergency Medicine | Admitting: Emergency Medicine

## 2017-11-23 ENCOUNTER — Encounter: Payer: Self-pay | Admitting: Emergency Medicine

## 2017-11-23 DIAGNOSIS — I10 Essential (primary) hypertension: Secondary | ICD-10-CM | POA: Insufficient documentation

## 2017-11-23 DIAGNOSIS — B9789 Other viral agents as the cause of diseases classified elsewhere: Secondary | ICD-10-CM | POA: Insufficient documentation

## 2017-11-23 DIAGNOSIS — J069 Acute upper respiratory infection, unspecified: Secondary | ICD-10-CM

## 2017-11-23 LAB — INFLUENZA PANEL BY PCR (TYPE A & B)
INFLAPCR: NEGATIVE
INFLBPCR: NEGATIVE

## 2017-11-23 MED ORDER — IPRATROPIUM-ALBUTEROL 0.5-2.5 (3) MG/3ML IN SOLN
3.0000 mL | Freq: Once | RESPIRATORY_TRACT | Status: AC
  Administered 2017-11-23: 3 mL via RESPIRATORY_TRACT
  Filled 2017-11-23: qty 3

## 2017-11-23 MED ORDER — ALBUTEROL SULFATE HFA 108 (90 BASE) MCG/ACT IN AERS: 2.0000 | INHALATION_SPRAY | Freq: Four times a day (QID) | RESPIRATORY_TRACT | 0 refills | Status: DC | PRN

## 2017-11-23 MED ORDER — BENZONATATE 100 MG PO CAPS: 100.0000 mg | ORAL_CAPSULE | Freq: Three times a day (TID) | ORAL | 0 refills | Status: DC | PRN

## 2017-11-23 MED ORDER — PREDNISONE 10 MG PO TABS: ORAL_TABLET | ORAL | 0 refills | Status: DC

## 2017-11-23 NOTE — ED Notes (Signed)
See triage note  Presents with nasal congestion,sore throat and subjective fever  Afebrile on arrival

## 2017-11-23 NOTE — ED Triage Notes (Addendum)
PT to ED via POV with nasal congestion and cough xfew days, unknown fevers. PT ambulatory, A&OX4

## 2017-11-23 NOTE — ED Provider Notes (Signed)
[facility] Emergency Department Provider Note  ____________________________________________  Time seen: Approximately 8:12 AM  I have reviewed the triage vital signs and the nursing notes.   HISTORY  Chief Complaint Nasal Congestion    HPI Jaime Cook is a 57 y.o. male that presents to the emergency department for evaluation of sore throat, nasal congestion, and productive cough with green sputum for 2 days. Patient quit smoking 20 years ago. He had pneumonia several years ago.  He was around chickens over the weekend.  He states that chickens were healthy and were [employer] chickens. No fever, chills, SOB, vomiting.    Past Medical History:  Diagnosis Date  . Heart murmur   . Hypertension     There are no active problems to display for this patient.   Past Surgical History:  Procedure Laterality Date  . TONSILLECTOMY      Prior to Admission medications   Medication Sig Start Date End Date Taking? Authorizing Provider  acetaminophen (TYLENOL) 500 MG tablet Take 2,000 mg by mouth 2 (two) times daily as needed for moderate pain.    [provider]  albuterol (PROVENTIL HFA;VENTOLIN HFA) 108 (90 Base) MCG/ACT inhaler Inhale 2 puffs into the lungs every 6 (six) hours as needed for wheezing or shortness of breath. 11/23/17   Enid Derry, PA-C  benzonatate (TESSALON PERLES) 100 MG capsule Take 1 capsule (100 mg total) by mouth 3 (three) times daily as needed for cough. 11/23/17 11/23/18  Enid Derry, PA-C  butalbital-acetaminophen-caffeine (FIORICET, ESGIC) [phone] MG tablet Take 1-2 tablets by mouth every 6 (six) hours as needed. 11/03/17 11/03/18  Emily Filbert, MD  ibuprofen (ADVIL,MOTRIN) 200 MG tablet Take 800 mg by mouth 2 (two) times daily as needed for moderate pain.    [provider]  predniSONE (DELTASONE) 10 MG tablet Take 6 tablets on day 1, take 5 tablets on day 2, take 4 tablets on day 3, take 3 tablets on day  4, take 2 tablets on day 5, take 1 tablet on day 6 11/23/17   Enid Derry, PA-C    Allergies Patient has no known allergies.  Family History  Problem Relation Age of Onset  . Hypertension Mother   . Heart failure Mother   . Hypertension Father   . Heart failure Father   . Cancer Father     Social History Social History   Tobacco Use  . Smoking status: Never Smoker  . Smokeless tobacco: Never Used  Substance Use Topics  . Alcohol use: Not on file  . Drug use: Not on file     Review of Systems  Constitutional: Negative for fever. Eyes: No visual changes. No discharge. ENT: Positive for congestion and rhinorrhea. Cardiovascular: No chest pain. Respiratory: Positive for cough. No SOB. Gastrointestinal: No abdominal pain.  No nausea, no vomiting. Musculoskeletal: Negative for musculoskeletal pain. Skin: Negative for rash, abrasions, lacerations, ecchymosis. Neurological: Negative for headaches.   ____________________________________________   PHYSICAL EXAM:  VITAL SIGNS: ED Triage Vitals [11/23/17 0743]  Enc Vitals Group     BP      Pulse Rate 78     Resp 16     Temp 98 F (36.7 C)     Temp Source Oral     SpO2 96 %     Weight 220 lb (99.8 kg)     Height 6\' 1"  (1.854 m)     Head Circumference      Peak Flow  Pain Score 0     Pain Loc      Pain Edu?      Excl. in GC?      Constitutional: Alert and oriented. Well appearing and in no acute distress. Eyes: Conjunctivae are normal. PERRL. EOMI. No discharge. Head: Atraumatic. ENT: No frontal and maxillary sinus tenderness.      Ears: Tympanic membranes pearly gray with good landmarks. No discharge.      Nose: Mild congestion/rhinnorhea.      Mouth/Throat: Mucous membranes are moist. Oropharynx non-erythematous. Tonsils not enlarged. No exudates. Uvula midline. Neck: No stridor.   Hematological/Lymphatic/Immunilogical: No cervical lymphadenopathy. Cardiovascular: Normal rate, regular rhythm.  Good  peripheral circulation. Respiratory: Normal respiratory effort without tachypnea or retractions. Lungs CTAB. Good air entry to the bases with no decreased or absent breath sounds. Gastrointestinal: Bowel sounds 4 quadrants. Soft and nontender to palpation. No guarding or rigidity. No palpable masses. No distention. Musculoskeletal: Full range of motion to all extremities. No gross deformities appreciated. Neurologic:  Normal speech and language. No gross focal neurologic deficits are appreciated.  Skin:  Skin is warm, dry and intact. No rash noted. Psychiatric: Mood and affect are normal. Speech and behavior are normal. Patient exhibits appropriate insight and judgement.   ____________________________________________   LABS (all labs ordered are listed, but only abnormal results are displayed)  Labs Reviewed  INFLUENZA PANEL BY PCR (TYPE A & B)   ____________________________________________  EKG   ____________________________________________  RADIOLOGY Lexine Baton, personally viewed and evaluated these images (plain radiographs) as part of my medical decision making, as well as reviewing the written report by the radiologist.  Dg Chest 2 View  Result Date: 11/23/2017 CLINICAL DATA:  Sore throat, productive cough and runny nose for 2 days, hypertension, heart murmur EXAM: CHEST - 2 VIEW COMPARISON:  09/04/2013 FINDINGS: Normal heart size, mediastinal contours, and pulmonary vascularity. Lungs clear. No pleural effusion or pneumothorax. Bones unremarkable. IMPRESSION: Normal exam. Electronically Signed   By: Ulyses Southward M.D.   On: 11/23/2017 08:56    ____________________________________________    PROCEDURES  Procedure(s) performed:    Procedures    Medications  ipratropium-albuterol (DUONEB) 0.5-2.5 (3) MG/3ML nebulizer solution 3 mL (3 mLs Nebulization Given 11/23/17 0832)     ____________________________________________   INITIAL IMPRESSION / ASSESSMENT  AND PLAN / ED COURSE  Pertinent labs & imaging results that were available during my care of the patient were reviewed by me and considered in my medical decision making (see chart for details).  Review of the [place] CSRS was performed in accordance of the [facility] prior to dispensing any controlled drugs.   Patient's diagnosis is consistent with viral URI. Vital signs and exam are reassuring.  Chest x-ray negative for acute cardiopulmonary process.  Flu test is negative.  Patient appears well and is staying well hydrated. Patient feels comfortable going home. Patient will be discharged home with prescriptions for prednisone, tessalon pearles, albuterol inhaler. Patient is to follow up with PCP as needed or otherwise directed. Patient is given ED precautions to return to the ED for any worsening or new symptoms.     ____________________________________________  FINAL CLINICAL IMPRESSION(S) / ED DIAGNOSES  Final diagnoses:  Viral URI with cough      NEW MEDICATIONS STARTED DURING THIS VISIT:  ED Discharge Orders         Ordered    predniSONE (DELTASONE) 10 MG tablet     11/23/17 0943    albuterol (PROVENTIL HFA;VENTOLIN HFA) 108 (90  Base) MCG/ACT inhaler  Every 6 hours PRN     11/23/17 0943    benzonatate (TESSALON PERLES) 100 MG capsule  3 times daily PRN     11/23/17 0943              This chart was dictated using voice recognition software/Dragon. Despite best efforts to proofread, errors can occur which can change the meaning. Any change was purely unintentional.    Enid Derry, PA-C 11/23/17 1410    Sharman Cheek, MD 11/23/17 1539

## 2018-11-01 ENCOUNTER — Encounter: Payer: Self-pay | Admitting: Emergency Medicine

## 2018-11-01 ENCOUNTER — Other Ambulatory Visit: Payer: Self-pay

## 2018-11-01 DIAGNOSIS — R011 Cardiac murmur, unspecified: Secondary | ICD-10-CM | POA: Insufficient documentation

## 2018-11-01 DIAGNOSIS — Z20828 Contact with and (suspected) exposure to other viral communicable diseases: Secondary | ICD-10-CM | POA: Insufficient documentation

## 2018-11-01 DIAGNOSIS — R002 Palpitations: Secondary | ICD-10-CM | POA: Insufficient documentation

## 2018-11-01 DIAGNOSIS — R7989 Other specified abnormal findings of blood chemistry: Secondary | ICD-10-CM | POA: Insufficient documentation

## 2018-11-01 DIAGNOSIS — I119 Hypertensive heart disease without heart failure: Secondary | ICD-10-CM | POA: Insufficient documentation

## 2018-11-01 DIAGNOSIS — R251 Tremor, unspecified: Secondary | ICD-10-CM | POA: Insufficient documentation

## 2018-11-01 DIAGNOSIS — Z8249 Family history of ischemic heart disease and other diseases of the circulatory system: Secondary | ICD-10-CM | POA: Insufficient documentation

## 2018-11-01 DIAGNOSIS — I080 Rheumatic disorders of both mitral and aortic valves: Secondary | ICD-10-CM | POA: Insufficient documentation

## 2018-11-01 DIAGNOSIS — R531 Weakness: Secondary | ICD-10-CM | POA: Insufficient documentation

## 2018-11-01 DIAGNOSIS — Z0184 Encounter for antibody response examination: Secondary | ICD-10-CM | POA: Insufficient documentation

## 2018-11-01 DIAGNOSIS — R0789 Other chest pain: Principal | ICD-10-CM | POA: Insufficient documentation

## 2018-11-01 LAB — CBC WITH DIFFERENTIAL/PLATELET
Abs Immature Granulocytes: 0.02 10*3/uL (ref 0.00–0.07)
Basophils Absolute: 0 10*3/uL (ref 0.0–0.1)
Basophils Relative: 0 %
Eosinophils Absolute: 0 10*3/uL (ref 0.0–0.5)
Eosinophils Relative: 0 %
HCT: 42.2 % (ref 39.0–52.0)
Hemoglobin: 14.1 g/dL (ref 13.0–17.0)
Immature Granulocytes: 0 %
Lymphocytes Relative: 18 %
Lymphs Abs: 1.6 10*3/uL (ref 0.7–4.0)
MCH: 28.9 pg (ref 26.0–34.0)
MCHC: 33.4 g/dL (ref 30.0–36.0)
MCV: 86.5 fL (ref 80.0–100.0)
Monocytes Absolute: 0.8 10*3/uL (ref 0.1–1.0)
Monocytes Relative: 9 %
Neutro Abs: 6.8 10*3/uL (ref 1.7–7.7)
Neutrophils Relative %: 73 %
Platelets: 190 10*3/uL (ref 150–400)
RBC: 4.88 MIL/uL (ref 4.22–5.81)
RDW: 13.2 % (ref 11.5–15.5)
WBC: 9.3 10*3/uL (ref 4.0–10.5)
nRBC: 0 % (ref 0.0–0.2)

## 2018-11-01 LAB — COMPREHENSIVE METABOLIC PANEL
ALT: 36 U/L (ref 0–44)
AST: 35 U/L (ref 15–41)
Albumin: 4.5 g/dL (ref 3.5–5.0)
Alkaline Phosphatase: 65 U/L (ref 38–126)
Anion gap: 10 (ref 5–15)
BUN: 21 mg/dL — ABNORMAL HIGH (ref 6–20)
CO2: 25 mmol/L (ref 22–32)
Calcium: 9.1 mg/dL (ref 8.9–10.3)
Chloride: 105 mmol/L (ref 98–111)
Creatinine, Ser: 1.17 mg/dL (ref 0.61–1.24)
GFR calc Af Amer: >60 mL/min (ref >60)
GFR calc non Af Amer: >60 mL/min (ref >60)
Glucose, Bld: 97 mg/dL (ref 70–99)
Potassium: 3.8 mmol/L (ref 3.5–5.1)
Sodium: 140 mmol/L (ref 135–145)
Total Bilirubin: 0.9 mg/dL (ref 0.3–1.2)
Total Protein: 7.6 g/dL (ref 6.5–8.1)

## 2018-11-01 LAB — TROPONIN I (HIGH SENSITIVITY): Troponin I (High Sensitivity): 18 ng/L — ABNORMAL HIGH (ref <18)

## 2018-11-01 NOTE — ED Triage Notes (Signed)
Patient ambulatory to triage with steady gait, without difficulty or distress noted, mask in place; pt reports this evening his BP was elevated and sensation of "heart beating out of his chest"; denies CP, denies accomp symptoms, denies hx of same

## 2018-11-02 ENCOUNTER — Observation Stay
Admission: EM | Admit: 2018-11-02 | Discharge: 2018-11-03 | Disposition: A | Discharge status: Home / Self Care | Payer: Self-pay | Attending: Internal Medicine | Admitting: Internal Medicine

## 2018-11-02 ENCOUNTER — Emergency Department: Payer: Self-pay

## 2018-11-02 ENCOUNTER — Other Ambulatory Visit: Payer: Self-pay

## 2018-11-02 ENCOUNTER — Observation Stay
Admit: 2018-11-02 | Discharge: 2018-11-02 | Disposition: A | Discharge status: Home / Self Care | Payer: Self-pay | Attending: Internal Medicine | Admitting: Internal Medicine

## 2018-11-02 DIAGNOSIS — R778 Other specified abnormalities of plasma proteins: Secondary | ICD-10-CM

## 2018-11-02 DIAGNOSIS — R002 Palpitations: Secondary | ICD-10-CM

## 2018-11-02 DIAGNOSIS — R531 Weakness: Secondary | ICD-10-CM

## 2018-11-02 DIAGNOSIS — R079 Chest pain, unspecified: Secondary | ICD-10-CM | POA: Diagnosis present

## 2018-11-02 LAB — ECHOCARDIOGRAM COMPLETE
Height: 72 in
Weight: 3491.2 oz

## 2018-11-02 LAB — HIV ANTIBODY (ROUTINE TESTING W REFLEX): HIV Screen 4th Generation wRfx: NONREACTIVE

## 2018-11-02 LAB — SARS CORONAVIRUS 2 (TAT 6-24 HRS): SARS Coronavirus 2: NEGATIVE

## 2018-11-02 LAB — TROPONIN I (HIGH SENSITIVITY)
Troponin I (High Sensitivity): 18 ng/L — ABNORMAL HIGH (ref <18)
Troponin I (High Sensitivity): 19 ng/L — ABNORMAL HIGH (ref <18)

## 2018-11-02 LAB — TSH: TSH: 1.461 u[IU]/mL (ref 0.350–4.500)

## 2018-11-02 LAB — T4, FREE: Free T4: 0.76 ng/dL (ref 0.61–1.12)

## 2018-11-02 MED ORDER — ONDANSETRON HCL 4 MG PO TABS: 4.0000 mg | ORAL_TABLET | Freq: Four times a day (QID) | ORAL | Status: DC | PRN

## 2018-11-02 MED ORDER — ENOXAPARIN SODIUM 40 MG/0.4ML ~~LOC~~ SOLN
40.0000 mg | SUBCUTANEOUS | Status: DC
  Administered 2018-11-02 – 2018-11-03 (×2): 40 mg via SUBCUTANEOUS
  Filled 2018-11-02 (×2): qty 0.4

## 2018-11-02 MED ORDER — NITROGLYCERIN 0.4 MG SL SUBL: 0.4000 mg | SUBLINGUAL_TABLET | SUBLINGUAL | Status: DC | PRN

## 2018-11-02 MED ORDER — ACETAMINOPHEN 325 MG PO TABS
650.0000 mg | ORAL_TABLET | Freq: Four times a day (QID) | ORAL | Status: DC | PRN
  Administered 2018-11-02: 650 mg via ORAL
  Filled 2018-11-02 (×2): qty 2

## 2018-11-02 MED ORDER — SODIUM CHLORIDE 0.9% FLUSH
3.0000 mL | Freq: Two times a day (BID) | INTRAVENOUS | Status: DC
  Administered 2018-11-02 – 2018-11-03 (×3): 3 mL via INTRAVENOUS

## 2018-11-02 MED ORDER — ONDANSETRON HCL 4 MG/2ML IJ SOLN: 4.0000 mg | Freq: Four times a day (QID) | INTRAMUSCULAR | Status: DC | PRN

## 2018-11-02 MED ORDER — SODIUM CHLORIDE 0.9 % IV BOLUS
1000.0000 mL | Freq: Once | INTRAVENOUS | Status: AC
  Administered 2018-11-02: 1000 mL via INTRAVENOUS

## 2018-11-02 MED ORDER — DOCUSATE SODIUM 100 MG PO CAPS
100.0000 mg | ORAL_CAPSULE | Freq: Two times a day (BID) | ORAL | Status: DC
  Administered 2018-11-02 – 2018-11-03 (×3): 100 mg via ORAL
  Filled 2018-11-02 (×3): qty 1

## 2018-11-02 MED ORDER — ASPIRIN 81 MG PO CHEW
324.0000 mg | CHEWABLE_TABLET | Freq: Once | ORAL | Status: AC
  Administered 2018-11-02: 324 mg via ORAL
  Filled 2018-11-02: qty 4

## 2018-11-02 MED ORDER — ACETAMINOPHEN 650 MG RE SUPP: 650.0000 mg | Freq: Four times a day (QID) | RECTAL | Status: DC | PRN

## 2018-11-02 MED ORDER — SODIUM CHLORIDE 0.9% FLUSH: 3.0000 mL | INTRAVENOUS | Status: DC | PRN

## 2018-11-02 MED ORDER — LORAZEPAM 1 MG PO TABS
1.0000 mg | ORAL_TABLET | ORAL | Status: DC | PRN
  Administered 2018-11-02 (×2): 1 mg via ORAL
  Filled 2018-11-02 (×2): qty 1

## 2018-11-02 NOTE — ED Provider Notes (Signed)
[facility] Emergency Department Provider Note   ____________________________________________   First MD Initiated Contact with Patient 11/02/18 0140     (approximate)  I have reviewed the triage vital signs and the nursing notes.   HISTORY  Chief Complaint Tachycardia    HPI Jaime Cook is a 58 y.o. male who presents to the ED from home with a chief complaint of generalized weakness, palpitations and chest discomfort.  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  Getting ready for bed tonight when he had a 2-hour episode of his heart racing.  Denies skipping beats.  Symptoms associated with elevated blood pressure and chest tightness.  Denies recent fever, cough, shortness of breath, abdominal pain, nausea or vomiting.  Denies recent travel, trauma, hormone use or exposures to persons diagnosed with coronavirus.        Past Medical History:  Diagnosis Date   Heart murmur    Hypertension     Patient Active Problem List   Diagnosis Date Noted   Chest pain 11/02/2018    Past Surgical History:  Procedure Laterality Date   TONSILLECTOMY      Prior to Admission medications   Medication Sig Start Date End Date Taking? Authorizing Provider  acetaminophen (TYLENOL) 500 MG tablet Take 2,000 mg by mouth 2 (two) times daily as needed for moderate pain.    [provider]    Allergies Patient has no known allergies.  Family History  Problem Relation Age of Onset   Hypertension Mother    Heart failure Mother    Hypertension Father    Heart failure Father    Cancer Father     Social History Social History   Tobacco Use   Smoking status: Never Smoker   Smokeless tobacco: Never Used  Substance Use Topics   Alcohol use: Not on file   Drug use: Not on file    Review of Systems  Constitutional: No fever/chills Eyes: No visual changes. ENT: No sore throat. Cardiovascular: Positive for  palpitations and chest pain. Respiratory: Denies shortness of breath. Gastrointestinal: No abdominal pain.  No nausea, no vomiting.  No diarrhea.  No constipation. Genitourinary: Negative for dysuria. Musculoskeletal: Negative for back pain. Skin: Negative for rash. Neurological: Negative for headaches, focal weakness or numbness.   ____________________________________________   PHYSICAL EXAM:  VITAL SIGNS: ED Triage Vitals [11/01/18 2301]  Enc Vitals Group     BP (!) 166/70     Pulse Rate 72     Resp 20     Temp (!) 97.5 F (36.4 C)     Temp Source Oral     SpO2 95 %     Weight      Height      Head Circumference      Peak Flow      Pain Score 0     Pain Loc      Pain Edu?      Excl. in [place]?     Constitutional: Alert and oriented.  Weak appearing and in mild acute distress. Eyes: Conjunctivae are normal. PERRL. EOMI. Head: Atraumatic. Nose: No congestion/rhinnorhea. Mouth/Throat: Mucous membranes are moist.  Oropharynx non-erythematous. Neck: No stridor.   Cardiovascular: Normal rate, regular rhythm. Grossly normal heart sounds.  Good peripheral circulation. Respiratory: Normal respiratory effort.  No retractions. Lungs CTAB. Gastrointestinal: Soft and nontender. No distention. No abdominal bruits. No CVA tenderness. Musculoskeletal: No lower extremity tenderness nor edema.  No joint effusions. Neurologic:  Normal speech and language. No gross focal neurologic deficits are appreciated. No gait instability. Skin:  Skin is warm, dry and intact. No rash noted. Psychiatric: Mood and affect are normal. Speech and behavior are normal.  ____________________________________________   LABS (all labs ordered are listed, but only abnormal results are displayed)  Labs Reviewed  COMPREHENSIVE METABOLIC PANEL - Abnormal; Notable for the following components:      Result Value   BUN 21 (*)    All other components within normal limits  TROPONIN I (HIGH SENSITIVITY) -  Abnormal; Notable for the following components:   Troponin I (High Sensitivity) 18 (*)    All other components within normal limits  TROPONIN I (HIGH SENSITIVITY) - Abnormal; Notable for the following components:   Troponin I (High Sensitivity) 18 (*)    All other components within normal limits  SARS CORONAVIRUS 2 (TAT 6-24 HRS)  CBC WITH DIFFERENTIAL/PLATELET  TSH  T4, FREE  HIV ANTIBODY (ROUTINE TESTING W REFLEX)  HIV4GL SAVE TUBE  TROPONIN I (HIGH SENSITIVITY)   ____________________________________________  EKG  ED ECG REPORT I, Keaton Beichner J, the attending physician, personally viewed and interpreted this ECG.   Date: 11/02/2018  EKG Time: 2306  Rate: 69  Rhythm: normal EKG, normal sinus rhythm  Axis: Normal  Intervals:none  ST&T Change: T wave inversion inferior leads New T wave inversion compared to EKG dated 11/04/2017  ____________________________________________  RADIOLOGY  ED MD interpretation: Cardiomegaly  Official radiology report(s): Dg Chest Port 1 View  Result Date: 11/02/2018 CLINICAL DATA:  Chest pain. EXAM: PORTABLE CHEST 1 VIEW COMPARISON:  11/23/2017 FINDINGS: Borderline cardiomegaly. Unchanged mediastinal contours. Slight peribronchial thickening. No acute airspace disease, pleural effusion, or pneumothorax. No acute osseous abnormalities. IMPRESSION: 1. Borderline cardiomegaly. 2. Mild peribronchial thickening, can be seen with bronchitis or asthma. Electronically Signed   By: Narda RutherfordMelanie  Sanford M.D.   On: 11/02/2018 02:24    ____________________________________________   PROCEDURES  Procedure(s) performed (including Critical Care):  Procedures   ____________________________________________   INITIAL IMPRESSION / ASSESSMENT AND PLAN / ED COURSE  As part of my medical decision making, I reviewed the following data within the electronic MEDICAL RECORD NUMBER History obtained from family, Nursing notes reviewed and incorporated, Labs reviewed, EKG  interpreted, Old chart reviewed, Radiograph reviewed, Discussed with admitting physician and Notes from prior ED visits     Remus BlakeRandy D Bastidas was evaluated in Emergency Department on 11/02/2018 for the symptoms described in the history of present illness. He was evaluated in the context of the global COVID-19 pandemic, which necessitated consideration that the patient might be at risk for infection with the SARS-CoV-2 virus that causes COVID-19. Institutional protocols and algorithms that pertain to the evaluation of patients at risk for COVID-19 are in a state of rapid change based on information released by regulatory bodies including the CDC and federal and state organizations. These policies and algorithms were followed during the patient's care in the ED.    58 year old male who presents with palpitations, generalized weakness and chest discomfort. Differential diagnosis includes, but is not limited to, ACS, aortic dissection, pulmonary embolism, cardiac tamponade, pneumothorax, pneumonia, pericarditis, myocarditis, GI-related causes including esophagitis/gastritis, and musculoskeletal chest wall pain.    Troponin is elevated.  Will check thyroid panel.  Given patient's symptoms and clinical generalized weakness, will administer aspirin, IV fluids and discuss with hospitalist services to evaluate patient in the emergency department for admission.      ____________________________________________   FINAL CLINICAL IMPRESSION(S) / ED DIAGNOSES  Final diagnoses:  Palpitations  Chest pain, unspecified type  Weakness generalized  Elevated troponin     ED Discharge Orders    None       Note:  This document was prepared using Dragon voice recognition software and may include unintentional dictation errors.   Irean Hong, MD 11/02/18 [phone]

## 2018-11-02 NOTE — Progress Notes (Signed)
Advanced care plan. Purpose of the Encounter: CODE STATUS Parties in Attendance: Patient Patient's Decision Capacity: Good Subjective/Patient's story: The patient with past medical history of hypertension and a heart murmur presents to the emergency department via EMS after an episode of chest pain.  The patient's wife reported the patient had chest discomfort but the patient reports that he has had palpitations only.  His palpitations were associated with some chest pressure and occasional shortness of breath.  However, the patient is more focused on a tremor that has developed since his palpitations started.  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  The patient ate supper and took a shower hoping that his nervous feeling would improve.  However after his shower he still felt warm and shaky.  The patient tried to lie down and get some rest in anticipation of having to work early this morning but he could not sleep.  When EMS arrived the patient reports his systolic blood pressure to be 183.  He does not remember heart rate at the time but does admit to some chest pressure which prompted the emergency department staff to call the hospitalist service for further evaluation. Objective/Medical story Needs serial troponin echocardiogram and cardiology consult and further testing for chest pain Goals of care determination:  Advance care directives goals of care treatment plan discussed Patient wants everything done which includes CPR, intubation ventilator if the need arises CODE STATUS: Full code Time spent discussing advanced care planning: 16 minutes

## 2018-11-02 NOTE — ED Notes (Signed)
ED TO INPATIENT HANDOFF REPORT  ED Nurse Name and Phone #: [place] [phone]  S Name/Age/Gender Jaime Cook 59 y.o. male Room/Bed: ED26A/ED26A  Code Status   Code Status: Not on file  Home/SNF/Other Home Patient oriented to: self, place, time and situation Is this baseline? Yes   Triage Complete: Triage complete  Chief Complaint CP  Triage Note Patient ambulatory to triage with steady gait, without difficulty or distress noted, mask in place; pt reports this evening his BP was elevated and sensation of "heart beating out of his chest"; denies CP, denies accomp symptoms, denies hx of same   Allergies No Known Allergies  Level of Care/Admitting Diagnosis ED Disposition    ED Disposition Condition [place]: [place] [[id number]]  Level of Care: Telemetry [5]  Covid Evaluation: Asymptomatic Screening Protocol (No Symptoms)  Diagnosis: Chest pain [379024]  Admitting Physician: Harrie Foreman [[id number]]  Attending Physician: Harrie Foreman [phone]  PT Class (Do Not Modify): Observation [104]  PT Acc Code (Do Not Modify): Observation [10022]       B Medical/Surgery History Past Medical History:  Diagnosis Date  . Heart murmur   . Hypertension    Past Surgical History:  Procedure Laterality Date  . TONSILLECTOMY       A IV Location/Drains/Wounds Patient Lines/Drains/Airways Status   Active Line/Drains/Airways    Name:   Placement date:   Placement time:   Site:   Days:   Peripheral IV 11/02/18 Left Antecubital   11/02/18    0206    Antecubital   less than 1          Intake/Output Last 24 hours No intake or output data in the 24 hours ending 11/02/18 0302  Labs/Imaging Results for orders placed or performed during the hospital encounter of 11/02/18 (from the past 48 hour(s))  CBC with Differential     Status: None   Collection Time: 11/01/18 11:04 PM  Result Value Ref Range   WBC 9.3 4.0 - 10.5 K/uL   RBC  4.88 4.22 - 5.81 MIL/uL   Hemoglobin 14.1 13.0 - 17.0 g/dL   HCT 42.2 39.0 - 52.0 %   MCV 86.5 80.0 - 100.0 fL   MCH 28.9 26.0 - 34.0 pg   MCHC 33.4 30.0 - 36.0 g/dL   RDW 13.2 11.5 - 15.5 %   Platelets 190 150 - 400 K/uL   nRBC 0.0 0.0 - 0.2 %   Neutrophils Relative % 73 %   Neutro Abs 6.8 1.7 - 7.7 K/uL   Lymphocytes Relative 18 %   Lymphs Abs 1.6 0.7 - 4.0 K/uL   Monocytes Relative 9 %   Monocytes Absolute 0.8 0.1 - 1.0 K/uL   Eosinophils Relative 0 %   Eosinophils Absolute 0.0 0.0 - 0.5 K/uL   Basophils Relative 0 %   Basophils Absolute 0.0 0.0 - 0.1 K/uL   Immature Granulocytes 0 %   Abs Immature Granulocytes 0.02 0.00 - 0.07 K/uL    Comment: Performed at [facility], [address] [place], [place] [address]  Comprehensive metabolic panel     Status: Abnormal   Collection Time: 11/01/18 11:04 PM  Result Value Ref Range   Sodium 140 135 - 145 mmol/L   Potassium 3.8 3.5 - 5.1 mmol/L   Chloride 105 98 - 111 mmol/L   CO2 25 22 - 32 mmol/L   Glucose, Bld 97 70 - 99 mg/dL   BUN 21 (  H) 6 - 20 mg/dL   Creatinine, Ser 2.951.17 0.61 - 1.24 mg/dL   Calcium 9.1 8.9 - 62.110.3 mg/dL   Total Protein 7.6 6.5 - 8.1 g/dL   Albumin 4.5 3.5 - 5.0 g/dL   AST 35 15 - 41 U/L   ALT 36 0 - 44 U/L   Alkaline Phosphatase 65 38 - 126 U/L   Total Bilirubin 0.9 0.3 - 1.2 mg/dL   GFR calc non Af Amer >60 >60 mL/min   GFR calc Af Amer >60 >60 mL/min   Anion gap 10 5 - 15    Comment: Performed at [facility], [address] [place], [place] [address]  Troponin I (High Sensitivity)     Status: Abnormal   Collection Time: 11/01/18 11:04 PM  Result Value Ref Range   Troponin I (High Sensitivity) 18 (H) <18 ng/L    Comment: (NOTE) Elevated high sensitivity troponin I (hsTnI) values and significant  changes across serial measurements may suggest ACS but many other  chronic and acute conditions are known to elevate hsTnI results.  Refer to the "Links" section for chest pain  algorithms and additional  guidance. Performed at [facility], [address] [place], [place] [address]   TSH     Status: None   Collection Time: 11/01/18 11:04 PM  Result Value Ref Range   TSH 1.461 0.350 - 4.500 uIU/mL    Comment: Performed by a 3rd Generation assay with a functional sensitivity of <=0.01 uIU/mL. Performed at [facility], [address] [place], [place] [address]   T4, free     Status: None   Collection Time: 11/01/18 11:04 PM  Result Value Ref Range   Free T4 0.76 0.61 - 1.12 ng/dL    Comment: (NOTE) Biotin ingestion may interfere with free T4 tests. If the results are inconsistent with the TSH level, previous test results, or the clinical presentation, then consider biotin interference. If needed, order repeat testing after stopping biotin. Performed at [facility], [address] [place], [place] [address]    Dg Chest Port 1 View  Result Date: 11/02/2018 CLINICAL DATA:  Chest pain. EXAM: PORTABLE CHEST 1 VIEW COMPARISON:  11/23/2017 FINDINGS: Borderline cardiomegaly. Unchanged mediastinal contours. Slight peribronchial thickening. No acute airspace disease, pleural effusion, or pneumothorax. No acute osseous abnormalities. IMPRESSION: 1. Borderline cardiomegaly. 2. Mild peribronchial thickening, can be seen with bronchitis or asthma. Electronically Signed   By: Narda RutherfordMelanie  Sanford M.D.   On: 11/02/2018 02:24    Pending Labs Unresulted Labs (From admission, onward)    Start     Ordered   11/02/18 0155  SARS CORONAVIRUS 2 (TAT 6-24 HRS) Nasopharyngeal Nasopharyngeal Swab  (Asymptomatic/Tier 2 Patients Labs)  Once,   STAT    Question Answer Comment  Is this test for diagnosis or screening Screening   Symptomatic for COVID-19 as defined by CDC No   Hospitalized for COVID-19 No   Admitted to ICU for COVID-19 No   Previously tested for COVID-19 No   Resident in a congregate (group) care setting No   Employed in healthcare  setting No      11/02/18 0154   Signed and Held  Creatinine, serum  (enoxaparin (LOVENOX)    CrCl >/= 30 ml/min)  Weekly,   R    Comments: while on enoxaparin therapy    Signed and Held   Signed and Held  HIV Antibody (routine testing w rflx)  (HIV Antibody (Routine testing w reflex) panel)  Once,   R     Signed and Held   Signed and Held  HIV4GL Save Tube  (HIV Antibody (Routine testing w reflex) panel)  Once,   R     Signed and Held          Vitals/Pain Today's Vitals   11/01/18 2301 11/02/18 0130  BP: (!) 166/70 (!) 149/75  Pulse: 72 (!) 59  Resp: 20 16  Temp: (!) 97.5 F (36.4 C)   TempSrc: Oral   SpO2: 95% 95%  PainSc: 0-No pain     Isolation Precautions No active isolations  Medications Medications  aspirin chewable tablet 324 mg (324 mg Oral Given 11/02/18 0207)  sodium chloride 0.9 % bolus 1,000 mL (1,000 mLs Intravenous New Bag/Given 11/02/18 0206)    Mobility walks Low fall risk   Focused Assessments Cardiac Assessment Handoff:  Cardiac Rhythm: Sinus bradycardia Lab Results  Component Value Date   TROPONINI <0.03 11/03/2017   No results found for: DDIMER Does the Patient currently have chest pain? No     R Recommendations: See Admitting Provider Note  Report given to:   Additional Notes:

## 2018-11-02 NOTE — H&P (Signed)
Jaime Cook is an 58 y.o. male.   Chief Complaint: Chest pain HPI: The patient with past medical history of hypertension and a heart murmur presents to the emergency department via EMS after an episode of chest pain.  The patient's wife reported the patient had chest discomfort but the patient reports that he has had palpitations only.  His palpitations were associated with some chest pressure and occasional shortness of breath.  However, the patient is more focused on a tremor that has developed since his palpitations started.  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  The patient ate supper and took a shower hoping that his nervous feeling would improve.  However after his shower he still felt warm and shaky.  The patient tried to lie down and get some rest in anticipation of having to work early this morning but he could not sleep.  When EMS arrived the patient reports his systolic blood pressure to be 183.  He does not remember heart rate at the time but does admit to some chest pressure which prompted the emergency department staff to call the hospitalist service for further evaluation.  Past Medical History:  Diagnosis Date  . Heart murmur   . Hypertension     Past Surgical History:  Procedure Laterality Date  . TONSILLECTOMY      Family History  Problem Relation Age of Onset  . Hypertension Mother   . Heart failure Mother   . Hypertension Father   . Heart failure Father   . Cancer Father    Social History:  reports that he has never smoked. He has never used smokeless tobacco. No history on file for alcohol and drug.  Allergies: No Known Allergies  Medications Prior to Admission  Medication Sig Dispense Refill  . acetaminophen (TYLENOL) 500 MG tablet Take 2,000 mg by mouth 2 (two) times daily as needed for moderate pain.      Results for orders placed or performed during the hospital encounter of 11/02/18 (from the past 48 hour(s))  CBC with  Differential     Status: None   Collection Time: 11/01/18 11:04 PM  Result Value Ref Range   WBC 9.3 4.0 - 10.5 K/uL   RBC 4.88 4.22 - 5.81 MIL/uL   Hemoglobin 14.1 13.0 - 17.0 g/dL   HCT 42.2 39.0 - 52.0 %   MCV 86.5 80.0 - 100.0 fL   MCH 28.9 26.0 - 34.0 pg   MCHC 33.4 30.0 - 36.0 g/dL   RDW 13.2 11.5 - 15.5 %   Platelets 190 150 - 400 K/uL   nRBC 0.0 0.0 - 0.2 %   Neutrophils Relative % 73 %   Neutro Abs 6.8 1.7 - 7.7 K/uL   Lymphocytes Relative 18 %   Lymphs Abs 1.6 0.7 - 4.0 K/uL   Monocytes Relative 9 %   Monocytes Absolute 0.8 0.1 - 1.0 K/uL   Eosinophils Relative 0 %   Eosinophils Absolute 0.0 0.0 - 0.5 K/uL   Basophils Relative 0 %   Basophils Absolute 0.0 0.0 - 0.1 K/uL   Immature Granulocytes 0 %   Abs Immature Granulocytes 0.02 0.00 - 0.07 K/uL    Comment: Performed at [facility], [address] [place], [place] [address]  Comprehensive metabolic panel     Status: Abnormal   Collection Time: 11/01/18 11:04 PM  Result Value Ref Range   Sodium 140 135 - 145 mmol/L   Potassium 3.8 3.5 - 5.1  mmol/L   Chloride 105 98 - 111 mmol/L   CO2 25 22 - 32 mmol/L   Glucose, Bld 97 70 - 99 mg/dL   BUN 21 (H) 6 - 20 mg/dL   Creatinine, Ser 6.23 0.61 - 1.24 mg/dL   Calcium 9.1 8.9 - 76.2 mg/dL   Total Protein 7.6 6.5 - 8.1 g/dL   Albumin 4.5 3.5 - 5.0 g/dL   AST 35 15 - 41 U/L   ALT 36 0 - 44 U/L   Alkaline Phosphatase 65 38 - 126 U/L   Total Bilirubin 0.9 0.3 - 1.2 mg/dL   GFR calc non Af Amer >60 >60 mL/min   GFR calc Af Amer >60 >60 mL/min   Anion gap 10 5 - 15    Comment: Performed at [facility], [address], [place], [place] [address]  Troponin I (High Sensitivity)     Status: Abnormal   Collection Time: 11/01/18 11:04 PM  Result Value Ref Range   Troponin I (High Sensitivity) 18 (H) <18 ng/L    Comment: (NOTE) Elevated high sensitivity troponin I (hsTnI) values and significant  changes across serial measurements may suggest ACS but many  other  chronic and acute conditions are known to elevate hsTnI results.  Refer to the "Links" section for chest pain algorithms and additional  guidance. Performed at [facility], [address] [place], [place] [address]   TSH     Status: None   Collection Time: 11/01/18 11:04 PM  Result Value Ref Range   TSH 1.461 0.350 - 4.500 uIU/mL    Comment: Performed by a 3rd Generation assay with a functional sensitivity of <=0.01 uIU/mL. Performed at [facility], [address] [place], [place] [address]   T4, free     Status: None   Collection Time: 11/01/18 11:04 PM  Result Value Ref Range   Free T4 0.76 0.61 - 1.12 ng/dL    Comment: (NOTE) Biotin ingestion may interfere with free T4 tests. If the results are inconsistent with the TSH level, previous test results, or the clinical presentation, then consider biotin interference. If needed, order repeat testing after stopping biotin. Performed at [facility], [address] [place], [place] [address]   Troponin I (High Sensitivity)     Status: Abnormal   Collection Time: 11/02/18  4:29 AM  Result Value Ref Range   Troponin I (High Sensitivity) 18 (H) <18 ng/L    Comment: (NOTE) Elevated high sensitivity troponin I (hsTnI) values and significant  changes across serial measurements may suggest ACS but many other  chronic and acute conditions are known to elevate hsTnI results.  Refer to the "Links" section for chest pain algorithms and additional  guidance. Performed at [facility], [address] [place], [place] [address]   Troponin I (High Sensitivity)     Status: Abnormal   Collection Time: 11/02/18  5:52 AM  Result Value Ref Range   Troponin I (High Sensitivity) 19 (H) <18 ng/L    Comment: (NOTE) Elevated high sensitivity troponin I (hsTnI) values and significant  changes across serial measurements may suggest ACS but many other  chronic and acute conditions are known to  elevate hsTnI results.  Refer to the "Links" section for chest pain algorithms and additional  guidance. Performed at [facility], [address] [place], [place] [address]    Dg Chest Port 1 View  Result Date: 11/02/2018 CLINICAL DATA:  Chest pain. EXAM: PORTABLE CHEST 1  VIEW COMPARISON:  11/23/2017 FINDINGS: Borderline cardiomegaly. Unchanged mediastinal contours. Slight peribronchial thickening. No acute airspace disease, pleural effusion, or pneumothorax. No acute osseous abnormalities. IMPRESSION: 1. Borderline cardiomegaly. 2. Mild peribronchial thickening, can be seen with bronchitis or asthma. Electronically Signed   By: Narda RutherfordMelanie  Sanford M.D.   On: 11/02/2018 02:24    Review of Systems  Constitutional: Negative for chills and fever.  HENT: Negative for sore throat and tinnitus.   Eyes: Negative for blurred vision and redness.  Respiratory: Negative for cough and shortness of breath.   Cardiovascular: Positive for palpitations. Negative for chest pain, orthopnea and PND.  Gastrointestinal: Negative for abdominal pain, diarrhea, nausea and vomiting.  Genitourinary: Negative for dysuria, frequency and urgency.  Musculoskeletal: Negative for joint pain and myalgias.  Skin: Negative for rash.       No lesions  Neurological: Positive for tremors. Negative for speech change, focal weakness and weakness.  Endo/Heme/Allergies: Does not bruise/bleed easily.       No temperature intolerance  Psychiatric/Behavioral: Negative for depression and suicidal ideas.    Blood pressure (!) 166/82, pulse 71, temperature 97.8 F (36.6 C), temperature source Oral, resp. rate 19, height 6' (1.829 m), weight 99 kg, SpO2 100 %. Physical Exam  Vitals reviewed. Constitutional: He is oriented to person, place, and time. He appears well-developed and well-nourished. No distress.  HENT:  Head: Normocephalic and atraumatic.  Mouth/Throat: Oropharynx is clear and moist.  Eyes: Pupils are  equal, round, and reactive to light. Conjunctivae and EOM are normal. No scleral icterus.  Neck: Normal range of motion. Neck supple. No JVD present. No tracheal deviation present. No thyromegaly present.  Cardiovascular: Normal rate, regular rhythm and normal heart sounds. Exam reveals no gallop and no friction rub.  No murmur heard. Respiratory: Effort normal and breath sounds normal. No respiratory distress.  GI: Soft. Bowel sounds are normal. He exhibits no distension. There is no abdominal tenderness.  Genitourinary:    Genitourinary Comments: Deferred   Musculoskeletal: Normal range of motion.        General: No edema.  Lymphadenopathy:    He has no cervical adenopathy.  Neurological: He is alert and oriented to person, place, and time. No cranial nerve deficit.  Skin: Skin is warm and dry. No rash noted. No erythema.  Psychiatric: He has a normal mood and affect. His behavior is normal. Judgment and thought content normal.     Assessment/Plan This is a 58 year old male admitted for automatic palpitations. 1.  Palpitations: Associated with pressure/chest pain.  The patient denies overt pain at this time.  Continue to monitor telemetry.  No tacky arrhythmias noted so far.   2.  Elevated troponin: Continue to follow cardiac biomarkers.  Manage blood pressure.  Consult cardiology 3.  Hypertension: Uncontrolled; hydralazine as needed 4.  Aortic regurg: On clinical exam.  The patient reports a known murmur but I do not see an echocardiogram on file.  Elevated blood pressure and palpitations may be secondary to dynamic changes associated with increased end systolic pressures.  Obtain echocardiogram. 5.  DVT prophylaxis: Lovenox 6.  GI prophylaxis: The patient is a full code.  Time spent on admission orders and patient care approximately 45 minutes  Arnaldo Nataliamond,  Jalyne Brodzinski S, MD 11/02/2018, 7:16 AM

## 2018-11-02 NOTE — Consult Note (Signed)
The reason for Consult: Borderline troponins palpitation chest pain Referring Physician: Dr. Rosilyn Mings hospitalist [facility] primary  Jaime Cook is an 58 y.o. male.  HPI: 58 year old with history of hypertension heart murmur reported aortic insufficiency in the past presented with episode of chest pain.  Patient wife reported that patient had chest discomfort on and off over the past 24 hours he also complained of palpitations chest pressure some mild shortness of breath.  Patient had some tremor-like sensations and fidgety feeling.  Patient ate dinner felt nervous had a shower still felt shaky had difficulty sleeping found his blood pressure was in the 180s he presented to emergency room for further assessment no previous cardiac history works as a [employer] manager did not smoke  Past Medical History:  Diagnosis Date  . Heart murmur   . Hypertension     Past Surgical History:  Procedure Laterality Date  . TONSILLECTOMY      Family History  Problem Relation Age of Onset  . Hypertension Mother   . Heart failure Mother   . Hypertension Father   . Heart failure Father   . Cancer Father     Social History:  reports that he has never smoked. He has never used smokeless tobacco. No history on file for alcohol and drug.  Allergies: No Known Allergies  Medications: I have reviewed the patient's current medications.  Results for orders placed or performed during the hospital encounter of 11/02/18 (from the past 48 hour(s))  CBC with Differential     Status: None   Collection Time: 11/01/18 11:04 PM  Result Value Ref Range   WBC 9.3 4.0 - 10.5 K/uL   RBC 4.88 4.22 - 5.81 MIL/uL   Hemoglobin 14.1 13.0 - 17.0 g/dL   HCT 42.2 39.0 - 52.0 %   MCV 86.5 80.0 - 100.0 fL   MCH 28.9 26.0 - 34.0 pg   MCHC 33.4 30.0 - 36.0 g/dL   RDW 13.2 11.5 - 15.5 %   Platelets 190 150 - 400 K/uL   nRBC 0.0 0.0 - 0.2 %   Neutrophils Relative % 73 %   Neutro Abs 6.8 1.7 - 7.7 K/uL    Lymphocytes Relative 18 %   Lymphs Abs 1.6 0.7 - 4.0 K/uL   Monocytes Relative 9 %   Monocytes Absolute 0.8 0.1 - 1.0 K/uL   Eosinophils Relative 0 %   Eosinophils Absolute 0.0 0.0 - 0.5 K/uL   Basophils Relative 0 %   Basophils Absolute 0.0 0.0 - 0.1 K/uL   Immature Granulocytes 0 %   Abs Immature Granulocytes 0.02 0.00 - 0.07 K/uL    Comment: Performed at [facility], [address] [place], [place] [address]  Comprehensive metabolic panel     Status: Abnormal   Collection Time: 11/01/18 11:04 PM  Result Value Ref Range   Sodium 140 135 - 145 mmol/L   Potassium 3.8 3.5 - 5.1 mmol/L   Chloride 105 98 - 111 mmol/L   CO2 25 22 - 32 mmol/L   Glucose, Bld 97 70 - 99 mg/dL   BUN 21 (H) 6 - 20 mg/dL   Creatinine, Ser 1.17 0.61 - 1.24 mg/dL   Calcium 9.1 8.9 - 10.3 mg/dL   Total Protein 7.6 6.5 - 8.1 g/dL   Albumin 4.5 3.5 - 5.0 g/dL   AST 35 15 - 41 U/L   ALT 36 0 - 44 U/L   Alkaline Phosphatase 65 38 - 126 U/L  Total Bilirubin 0.9 0.3 - 1.2 mg/dL   GFR calc non Af Amer >60 >60 mL/min   GFR calc Af Amer >60 >60 mL/min   Anion gap 10 5 - 15    Comment: Performed at [facility], [address] [place], [place] [address]  Troponin I (High Sensitivity)     Status: Abnormal   Collection Time: 11/01/18 11:04 PM  Result Value Ref Range   Troponin I (High Sensitivity) 18 (H) <18 ng/L    Comment: (NOTE) Elevated high sensitivity troponin I (hsTnI) values and significant  changes across serial measurements may suggest ACS but many other  chronic and acute conditions are known to elevate hsTnI results.  Refer to the "Links" section for chest pain algorithms and additional  guidance. Performed at [facility], [address] [place], [place] [address]   TSH     Status: None   Collection Time: 11/01/18 11:04 PM  Result Value Ref Range   TSH 1.461 0.350 - 4.500 uIU/mL    Comment: Performed by a 3rd Generation assay with a functional sensitivity of  <=0.01 uIU/mL. Performed at [facility], [address] [place], [place] [address]   T4, free     Status: None   Collection Time: 11/01/18 11:04 PM  Result Value Ref Range   Free T4 0.76 0.61 - 1.12 ng/dL    Comment: (NOTE) Biotin ingestion may interfere with free T4 tests. If the results are inconsistent with the TSH level, previous test results, or the clinical presentation, then consider biotin interference. If needed, order repeat testing after stopping biotin. Performed at [facility], [address] [place], [place] [address]   HIV Antibody (routine testing w rflx)     Status: None   Collection Time: 11/02/18  4:29 AM  Result Value Ref Range   HIV Screen 4th Generation wRfx NON REACTIVE NON REACTIVE    Comment: Performed at [facility], [address] [place], [place] [address]  Troponin I (High Sensitivity)     Status: Abnormal   Collection Time: 11/02/18  4:29 AM  Result Value Ref Range   Troponin I (High Sensitivity) 18 (H) <18 ng/L    Comment: (NOTE) Elevated high sensitivity troponin I (hsTnI) values and significant  changes across serial measurements may suggest ACS but many other  chronic and acute conditions are known to elevate hsTnI results.  Refer to the "Links" section for chest pain algorithms and additional  guidance. Performed at [facility], [address] [place], [place] [address]   Troponin I (High Sensitivity)     Status: Abnormal   Collection Time: 11/02/18  5:52 AM  Result Value Ref Range   Troponin I (High Sensitivity) 19 (H) <18 ng/L    Comment: (NOTE) Elevated high sensitivity troponin I (hsTnI) values and significant  changes across serial measurements may suggest ACS but many other  chronic and acute conditions are known to elevate hsTnI results.  Refer to the "Links" section for chest pain algorithms and additional  guidance. Performed at [facility], [address],  [place], [place] [address]     Dg Chest Port 1 View  Result Date: 11/02/2018 CLINICAL DATA:  Chest pain. EXAM: PORTABLE CHEST 1 VIEW COMPARISON:  11/23/2017 FINDINGS: Borderline cardiomegaly. Unchanged mediastinal contours. Slight peribronchial thickening. No acute airspace disease, pleural effusion, or pneumothorax. No acute osseous abnormalities. IMPRESSION: 1. Borderline cardiomegaly. 2. Mild peribronchial thickening, can be seen with bronchitis or asthma. Electronically  Signed   By: Narda RutherfordMelanie  Sanford M.D.   On: 11/02/2018 02:24    Review of Systems  Constitutional: Positive for malaise/fatigue.  HENT: Negative.   Eyes: Negative.   Respiratory: Positive for shortness of breath.   Cardiovascular: Positive for chest pain and palpitations.  Gastrointestinal: Negative.   Genitourinary: Negative.   Musculoskeletal: Negative.   Skin: Negative.   Neurological: Positive for dizziness.  Endo/Heme/Allergies: Negative.   Psychiatric/Behavioral: Negative.    Blood pressure (!) 151/83, pulse 72, temperature (!) 97.4 F (36.3 C), resp. rate 18, height 6' (1.829 m), weight 99 kg, SpO2 97 %. Physical Exam  Nursing note and vitals reviewed. Constitutional: He is oriented to person, place, and time. He appears well-developed and well-nourished.  HENT:  Head: Normocephalic and atraumatic.  Eyes: Pupils are equal, round, and reactive to light. Conjunctivae and EOM are normal.  Neck: Normal range of motion. Neck supple.  Cardiovascular: Normal rate, regular rhythm and normal heart sounds.  Respiratory: Effort normal and breath sounds normal.  GI: Soft. Bowel sounds are normal.  Musculoskeletal: Normal range of motion.  Neurological: He is alert and oriented to person, place, and time. He has normal reflexes.  Skin: Skin is warm and dry.  Psychiatric: He has a normal mood and affect.    Assessment/Plan: Chest pain Palpitations Murmur Hypertension Aortic insufficiency Borderline elevated  troponin . Conclusion Agree with admission for rule out myocardial infarction Follow-up cardiac enzymes and EKGs Agree with telemetry for evaluation of palpitations Echocardiogram for assessment of possible aortic regurgitation DVT prophylaxis Recommend GI prophylaxis Consider exercise Myoview to functional study  Follow-up borderline troponins  Makynli Stills D Thang Flett 11/02/2018, 2:07 PM

## 2018-11-02 NOTE — Progress Notes (Signed)
*  PRELIMINARY RESULTS* Echocardiogram 2D Echocardiogram has been performed.  Jaime Cook 11/02/2018, 1:50 PM

## 2018-11-02 NOTE — Progress Notes (Signed)
[employer] - [place] at [facility]   PATIENT NAME: Jaime Cook    MR#:  [id number]  DATE OF BIRTH:  1960-12-24  SUBJECTIVE:  CHIEF COMPLAINT:   Chief Complaint  Patient presents with  . Tachycardia  Patient seen and evaluated today No new episodes of chest pain No shortness of breath No fever or chills  REVIEW OF SYSTEMS:    ROS  CONSTITUTIONAL: No documented fever. No fatigue, weakness. No weight gain, no weight loss.  EYES: No blurry or double vision.  ENT: No tinnitus. No postnasal drip. No redness of the oropharynx.  RESPIRATORY: No cough, no wheeze, no hemoptysis. No dyspnea.  CARDIOVASCULAR: No chest pain. No orthopnea. No palpitations. No syncope.  GASTROINTESTINAL: No nausea, no vomiting or diarrhea. No abdominal pain. No melena or hematochezia.  GENITOURINARY: No dysuria or hematuria.  ENDOCRINE: No polyuria or nocturia. No heat or cold intolerance.  HEMATOLOGY: No anemia. No bruising. No bleeding.  INTEGUMENTARY: No rashes. No lesions.  MUSCULOSKELETAL: No arthritis. No swelling. No gout.  NEUROLOGIC: No numbness, tingling, or ataxia. No seizure-type activity.  PSYCHIATRIC: No anxiety. No insomnia. No ADD.   DRUG ALLERGIES:  No Known Allergies  VITALS:  Blood pressure (!) 151/83, pulse 72, temperature (!) 97.4 F (36.3 C), resp. rate 18, height 6' (1.829 m), weight 99 kg, SpO2 97 %.  PHYSICAL EXAMINATION:   Physical Exam  GENERAL:  58 y.o.-year-old patient lying in the bed with no acute distress.  EYES: Pupils equal, round, reactive to light and accommodation. No scleral icterus. Extraocular muscles intact.  HEENT: Head atraumatic, normocephalic. Oropharynx and nasopharynx clear.  NECK:  Supple, no jugular venous distention. No thyroid enlargement, no tenderness.  LUNGS: Normal breath sounds bilaterally, no wheezing, rales, rhonchi. No use of accessory muscles of respiration.  CARDIOVASCULAR: S1, S2 normal. No murmurs, rubs, or gallops.   ABDOMEN: Soft, nontender, nondistended. Bowel sounds present. No organomegaly or mass.  EXTREMITIES: No cyanosis, clubbing or edema b/l.    NEUROLOGIC: Cranial nerves II through XII are intact. No focal Motor or sensory deficits b/l.   PSYCHIATRIC: The patient is alert and oriented x 3.  SKIN: No obvious rash, lesion, or ulcer.   LABORATORY PANEL:   CBC Recent Labs  Lab 11/01/18 2304  WBC 9.3  HGB 14.1  HCT 42.2  PLT 190   ------------------------------------------------------------------------------------------------------------------ Chemistries  Recent Labs  Lab 11/01/18 2304  NA 140  K 3.8  CL 105  CO2 25  GLUCOSE 97  BUN 21*  CREATININE 1.17  CALCIUM 9.1  AST 35  ALT 36  ALKPHOS 65  BILITOT 0.9   ------------------------------------------------------------------------------------------------------------------  Cardiac Enzymes No results for input(s): TROPONINI in the last 168 hours. ------------------------------------------------------------------------------------------------------------------  RADIOLOGY:  Dg Chest Port 1 View  Result Date: 11/02/2018 CLINICAL DATA:  Chest pain. EXAM: PORTABLE CHEST 1 VIEW COMPARISON:  11/23/2017 FINDINGS: Borderline cardiomegaly. Unchanged mediastinal contours. Slight peribronchial thickening. No acute airspace disease, pleural effusion, or pneumothorax. No acute osseous abnormalities. IMPRESSION: 1. Borderline cardiomegaly. 2. Mild peribronchial thickening, can be seen with bronchitis or asthma. Electronically Signed   By: Narda Rutherford M.D.   On: 11/02/2018 02:24     ASSESSMENT AND PLAN:   58 year old male patient with history of hypertension, heart murmur currently under hospitalist service  -Chest pain Monitor on telemetry Cardiology consult follow-up Borderline troponins Await cardiology recommendation for further testing  -Uncontrolled hypertension Control blood pressure with hydralazine  -DVT  prophylaxis subcu Lovenox daily  All the records are reviewed  and case discussed with Care Management/Social Worker. Management plans discussed with the patient, family and they are in agreement.  CODE STATUS: Full code  DVT Prophylaxis: SCDs  TOTAL TIME TAKING CARE OF THIS PATIENT: 45 minutes.   POSSIBLE D/C IN 1 to 2 DAYS, DEPENDING ON CLINICAL CONDITION.  Saundra Shelling M.D on 11/02/2018 at 9:31 AM  Between 7am to 6pm - Pager - [phone]  After 6pm go to [email or web address] - password [email or web address] [place] [facility]  Office  [phone]  CC: Primary care physician; [employer]  Note: This dictation was prepared with Diplomatic Services operational officer dictation along with smaller Company secretary. Any transcriptional errors that result from this process are unintentional.

## 2018-11-03 ENCOUNTER — Observation Stay: Payer: Self-pay

## 2018-11-03 LAB — NM MYOCAR MULTI W/SPECT W/WALL MOTION / EF
Estimated workload: 1 METS
Exercise duration (min): 1 min
Exercise duration (sec): 0 s
LV dias vol: 166 mL (ref 62–150)
LV sys vol: 78 mL
MPHR: 165 {beats}/min
Peak HR: 92 {beats}/min
Percent HR: 55 %
Rest HR: 75 {beats}/min
SDS: 0
SRS: 1
SSS: 0
TID: 1.11

## 2018-11-03 MED ORDER — TECHNETIUM TC 99M TETROFOSMIN IV KIT
30.0000 | PACK | Freq: Once | INTRAVENOUS | Status: AC | PRN
  Administered 2018-11-03: 32.333 via INTRAVENOUS

## 2018-11-03 MED ORDER — REGADENOSON 0.4 MG/5ML IV SOLN
0.4000 mg | Freq: Once | INTRAVENOUS | Status: AC
  Administered 2018-11-03: 0.4 mg via INTRAVENOUS

## 2018-11-03 MED ORDER — TECHNETIUM TC 99M TETROFOSMIN IV KIT
10.0000 | PACK | Freq: Once | INTRAVENOUS | Status: AC | PRN
  Administered 2018-11-03: 10.38 via INTRAVENOUS

## 2018-11-03 MED ORDER — POLYETHYLENE GLYCOL 3350 17 G PO PACK: 17.0000 g | PACK | Freq: Every day | ORAL | Status: DC

## 2018-11-03 NOTE — Discharge Summary (Signed)
[employer] - [place] at [facility]   PATIENT NAME: Jaime Cook    MR#:  [id number]  DATE OF BIRTH:  11/04/1960  DATE OF ADMISSION:  11/02/2018 ADMITTING PHYSICIAN: Arnaldo Natal, MD  DATE OF DISCHARGE: 11/03/2018  PRIMARY CARE PHYSICIAN: [employer]   ADMISSION DIAGNOSIS:  Palpitations [R00.2] Weakness generalized [R53.1] Elevated troponin [R77.8] Chest pain, unspecified type [R07.9]  DISCHARGE DIAGNOSIS:  Active Problems:   Chest pain Atypical chest pain  SECONDARY DIAGNOSIS:   Past Medical History:  Diagnosis Date  . Heart murmur   . Hypertension      ADMITTING HISTORY The patient with past medical history of hypertension and a heart murmur presents to the emergency department via EMS after an episode of chest pain.  The patient's wife reported the patient had chest discomfort but the patient reports that he has had palpitations only.  His palpitations were associated with some chest pressure and occasional shortness of breath.  However, the patient is more focused on a tremor that has developed since his palpitations started.  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  The patient ate supper and took a shower hoping that his nervous feeling would improve.  However after his shower he still felt warm and shaky.  The patient tried to lie down and get some rest in anticipation of having to work early this morning but he could not sleep.  When EMS arrived the patient reports his systolic blood pressure to be 412.  He does not remember heart rate at the time but does admit to some chest pressure which prompted the emergency department staff to call the hospitalist service for further evaluation.  HOSPITAL COURSE:  Patient was admitted to telemetry serial cardiac enzymes were negative patient was seen by cardiology.  He had a cardiac stress test which showed no ischemia.  Patient was worked up with echocardiogram  which showed EF of 60 to 65% with normal left ventricular septal wall thickness.  Chest pain completely resolved  CONSULTS OBTAINED:  Cardiology consult  DRUG ALLERGIES:  No Known Allergies  DISCHARGE MEDICATIONS:   Allergies as of 11/03/2018   No Known Allergies     Medication List    STOP taking these medications   acetaminophen 500 MG tablet Commonly known as: TYLENOL       Today  Patient seen today Tolerated diet well Hemodynamically stable VITAL SIGNS:  Blood pressure (!) 144/81, pulse 72, temperature 97.7 F (36.5 C), temperature source Oral, resp. rate 16, height 6' (1.829 m), weight 98.8 kg, SpO2 98 %.  I/O:  No intake or output data in the 24 hours ending 11/03/18 1346  PHYSICAL EXAMINATION:  Physical Exam  GENERAL:  58 y.o.-year-old patient lying in the bed with no acute distress.  LUNGS: Normal breath sounds bilaterally, no wheezing, rales,rhonchi or crepitation. No use of accessory muscles of respiration.  CARDIOVASCULAR: S1, S2 normal. No murmurs, rubs, or gallops.  ABDOMEN: Soft, non-tender, non-distended. Bowel sounds present. No organomegaly or mass.  NEUROLOGIC: Moves all 4 extremities. PSYCHIATRIC: The patient is alert and oriented x 3.  SKIN: No obvious rash, lesion, or ulcer.   DATA REVIEW:   CBC Recent Labs  Lab 11/01/18 2304  WBC 9.3  HGB 14.1  HCT 42.2  PLT 190    Chemistries  Recent Labs  Lab 11/01/18 2304  NA 140  K 3.8  CL 105  CO2 25  GLUCOSE 97  BUN 21*  CREATININE  1.17  CALCIUM 9.1  AST 35  ALT 36  ALKPHOS 65  BILITOT 0.9    Cardiac Enzymes No results for input(s): TROPONINI in the last 168 hours.  Microbiology Results  Results for orders placed or performed during the hospital encounter of 11/02/18  SARS CORONAVIRUS 2 (TAT 6-24 HRS) Nasopharyngeal Nasopharyngeal Swab     Status: None   Collection Time: 11/02/18  2:07 AM   Specimen: Nasopharyngeal Swab  Result Value Ref Range Status   SARS Coronavirus 2  NEGATIVE NEGATIVE Final    Comment: (NOTE) SARS-CoV-2 target nucleic acids are NOT DETECTED. The SARS-CoV-2 RNA is generally detectable in upper and lower respiratory specimens during the acute phase of infection. Negative results do not preclude SARS-CoV-2 infection, do not rule out co-infections with other pathogens, and should not be used as the sole basis for treatment or other patient management decisions. Negative results must be combined with clinical observations, patient history, and epidemiological information. The expected result is Negative. Fact Sheet for Patients: SugarRoll.be Fact Sheet for Healthcare Providers: https://www.woods-mathews.com/ This test is not yet approved or cleared by the [place] FDA and  has been authorized for detection and/or diagnosis of SARS-CoV-2 by FDA under an Emergency Use Authorization (EUA). This EUA will remain  in effect (meaning this test can be used) for the duration of the COVID-19 declaration under Section 56 4(b)(1) of the Act, 21 U.S.C. section 360bbb-3(b)(1), unless the authorization is terminated or revoked sooner. Performed at [facility], [place] [address], [place], [place] [address]     RADIOLOGY:  Dg Chest Port 1 View  Result Date: 11/02/2018 CLINICAL DATA:  Chest pain. EXAM: PORTABLE CHEST 1 VIEW COMPARISON:  11/23/2017 FINDINGS: Borderline cardiomegaly. Unchanged mediastinal contours. Slight peribronchial thickening. No acute airspace disease, pleural effusion, or pneumothorax. No acute osseous abnormalities. IMPRESSION: 1. Borderline cardiomegaly. 2. Mild peribronchial thickening, can be seen with bronchitis or asthma. Electronically Signed   By: Keith Rake M.D.   On: 11/02/2018 02:24    Follow up with PCP in 1 week.  Management plans discussed with the patient, family and they are in agreement.  CODE STATUS:     Code Status Orders  (From admission, onward)          Start     Ordered   11/02/18 0404  Full code  Continuous     11/02/18 0403        Code Status History    This patient has a current code status but no historical code status.   Advance Care Planning Activity      TOTAL TIME TAKING CARE OF THIS PATIENT ON DAY OF DISCHARGE: more than 34 minutes.   Saundra Shelling M.D on 11/03/2018 at 1:46 PM  Between 7am to 6pm - Pager - [phone]  After 6pm go to [email or web address] - password [email or web address] [place] [facility]  Office  [phone]  CC: Primary care physician; [employer]  Note: This dictation was prepared with Diplomatic Services operational officer dictation along with smaller Company secretary. Any transcriptional errors that result from this process are unintentional.

## 2018-11-03 NOTE — Progress Notes (Signed)
     Jaime Cook was admitted to the Hospital on 11/02/2018 and Discharged  11/03/2018 and should be excused from work/school   for 2  days starting 11/02/2018 , may return to work/school without any restrictions.  Can go back to work from 10 /3 without restriction.    Epifanio Lesches M.D on 11/03/2018,at 4:07 PM

## 2018-11-03 NOTE — Progress Notes (Signed)
Discharged to home with his wife.  No prescriptions. Provided a return to work release.

## 2019-05-12 ENCOUNTER — Ambulatory Visit: Payer: Self-pay | Attending: Internal Medicine

## 2019-05-12 DIAGNOSIS — Z23 Encounter for immunization: Secondary | ICD-10-CM

## 2019-05-12 NOTE — Progress Notes (Signed)
   Covid-19 Vaccination Clinic  Name:  PHONG ISENBERG    MRN: [id number] DOB: 02-Apr-1960  05/12/2019  Mr. Stoutenburg was observed post Covid-19 immunization for 15 minutes without incident. He was provided with Vaccine Information Sheet and instruction to access the V-Safe system.   Mr. Quam was instructed to call 911 with any severe reactions post vaccine: Marland Kitchen Difficulty breathing  . Swelling of face and throat  . A fast heartbeat  . A bad rash all over body  . Dizziness and weakness   Immunizations Administered    Name Date Dose VIS Date Route   Pfizer COVID-19 Vaccine 05/12/2019 12:51 PM 0.3 mL 01/12/2019 Intramuscular   Manufacturer: [employer], [employer]   Lot: [id number]   NDC: [address]-6

## 2019-06-06 ENCOUNTER — Ambulatory Visit: Payer: Self-pay | Attending: Internal Medicine

## 2019-06-06 DIAGNOSIS — Z23 Encounter for immunization: Secondary | ICD-10-CM

## 2019-06-06 NOTE — Progress Notes (Signed)
   Covid-19 Vaccination Clinic  Name:  Jaime Cook    MRN: [id number] DOB: 04-20-1960  06/06/2019  Jaime Cook was observed post Covid-19 immunization for 15 minutes without incident. He was provided with Vaccine Information Sheet and instruction to access the V-Safe system.   Jaime Cook was instructed to call 911 with any severe reactions post vaccine: Marland Kitchen Difficulty breathing  . Swelling of face and throat  . A fast heartbeat  . A bad rash all over body  . Dizziness and weakness   Immunizations Administered    Name Date Dose VIS Date Route   Pfizer COVID-19 Vaccine 06/06/2019  3:53 PM 0.3 mL 03/28/2018 Intramuscular   Manufacturer: [employer], [employer]   Lot: [id number]   NDC: [address]-4

## 2021-05-07 ENCOUNTER — Other Ambulatory Visit: Payer: Self-pay

## 2021-05-07 ENCOUNTER — Ambulatory Visit (INDEPENDENT_AMBULATORY_CARE_PROVIDER_SITE_OTHER): Payer: Self-pay

## 2021-05-07 ENCOUNTER — Ambulatory Visit
Admission: EM | Admit: 2021-05-07 | Discharge: 2021-05-07 | Disposition: A | Discharge status: Home / Self Care | Payer: Self-pay | Attending: Physician Assistant | Admitting: Physician Assistant

## 2021-05-07 DIAGNOSIS — M7918 Myalgia, other site: Secondary | ICD-10-CM

## 2021-05-07 DIAGNOSIS — M79604 Pain in right leg: Secondary | ICD-10-CM

## 2021-05-07 DIAGNOSIS — S8011XA Contusion of right lower leg, initial encounter: Secondary | ICD-10-CM

## 2021-05-07 DIAGNOSIS — M79661 Pain in right lower leg: Secondary | ICD-10-CM

## 2021-05-07 DIAGNOSIS — M7989 Other specified soft tissue disorders: Secondary | ICD-10-CM

## 2021-05-07 NOTE — ED Provider Notes (Signed)
?[facility] ? ? ? ?CSN: [id number] ?Arrival date & time: 05/07/21  1802 ? ? ?  ? ?History   ?Chief Complaint ?Chief Complaint  ?Patient presents with  ? Leg Injury  ? Leg Problem  ? ? ?HPI ?Jaime Cook is a 61 y.o. male presenting for right lower leg swelling for the past few days.  Patient says its been about 5 days or so.  He says his wife and mother think it has become more swollen.  Admits to moderate pain in this area and some bruising that started over the past 2 days or so.  Denies any injury but that he is very active at work.  Denies calf pain.  Patient does report some pain in his right buttocks.  He says he thought it was from sitting on his wallet but he has taken his wallet out and still has the discomfort.  That has been going on for about a week. Unsure if these 2 things are related.  Denies any associated back pain, numbness, tingling or weakness.  Patient denies any history of DVT or PE.  Does have a history of cardiac disease.  Patient reports he had to have stents placed last year.  Currently on Plavix.  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  Says he only sits for about an hour to hour and a half at a time then he is up moving around and unloading.  Denies chest pain/tightness or breathing difficulty.  No other complaints. ? ?HPI ? ?Past Medical History:  ?Diagnosis Date  ? Heart murmur   ? Hypertension   ? ? ?Patient Active Problem List  ? Diagnosis Date Noted  ? Chest pain 11/02/2018  ? ? ?Past Surgical History:  ?Procedure Laterality Date  ? TONSILLECTOMY    ? ? ? ? ? ?Home Medications   ? ?Prior to Admission medications   ?Not on File  ? ? ?Family History ?Family History  ?Problem Relation Age of Onset  ? Hypertension Mother   ? Heart failure Mother   ? Hypertension Father   ? Heart failure Father   ? Cancer Father   ? ? ?Social History ?Social History  ? ?Tobacco Use  ? Smoking status: Never  ? Smokeless tobacco: Never  ?Vaping Use  ? Vaping Use:  Never used  ?Substance Use Topics  ? Alcohol use: Never  ? Drug use: Never  ? ? ? ?Allergies   ?Patient has no known allergies. ? ? ?Review of Systems ?Review of Systems  ?Respiratory:  Negative for chest tightness and shortness of breath.   ?Cardiovascular:  Positive for leg swelling. Negative for chest pain.  ?Musculoskeletal:  Positive for arthralgias (Right buttocks). Negative for back pain, gait problem and joint swelling.  ?Skin:  Positive for color change. Negative for wound.  ?Neurological:  Negative for weakness and numbness.  ? ? ?Physical Exam ?Triage Vital Signs ?ED Triage Vitals  ?Enc Vitals Group  ?   BP 05/07/21 1821 (!) 153/75  ?   Pulse Rate 05/07/21 1821 83  ?   Resp 05/07/21 1821 18  ?   Temp 05/07/21 1821 98.1 ?F (36.7 ?C)  ?   Temp Source 05/07/21 1821 Oral  ?   SpO2 05/07/21 1821 94 %  ?   Weight 05/07/21 1819 232 lb (105.2 kg)  ?   Height 05/07/21 1819 6' (1.829 m)  ?   Head Circumference --   ?  Peak Flow --   ?   Pain Score 05/07/21 1819 6  ?   Pain Loc --   ?   Pain Edu? --   ?   Excl. in GC? --   ? ?No data found. ? ?Updated Vital Signs ?BP (!) 153/75 (BP Location: Left Arm)   Pulse 83   Temp 98.1 ?F (36.7 ?C) (Oral)   Resp 18   Ht 6' (1.829 m)   Wt 232 lb (105.2 kg)   SpO2 94%   BMI 31.46 kg/m?  ?  ? ?Physical Exam ?Vitals and nursing note reviewed.  ?Constitutional:   ?   General: He is not in acute distress. ?   Appearance: Normal appearance. He is well-developed. He is not ill-appearing.  ?HENT:  ?   Head: Normocephalic and atraumatic.  ?Eyes:  ?   General: No scleral icterus. ?   Conjunctiva/sclera: Conjunctivae normal.  ?Cardiovascular:  ?   Rate and Rhythm: Normal rate and regular rhythm.  ?   Heart sounds: Normal heart sounds.  ?Pulmonary:  ?   Effort: Pulmonary effort is normal. No respiratory distress.  ?   Breath sounds: Normal breath sounds.  ?Musculoskeletal:  ?   Cervical back: Neck supple.  ?   Lumbar back: Normal. Normal range of motion. Negative right straight leg  raise test and negative left straight leg raise test.  ?   Right hip: Tenderness (right buttocks/piriformis) present. Normal range of motion.  ?   Right lower leg: Swelling (mild swelling/moderate swelling right anterior medial lower leg with slight ecchymosis) and tenderness (area depicted) present. 2+ Pitting Edema present.  ?   Left lower leg: Swelling (smaller area of swelling in the same area on the opposite leg with 1+ pitting edema in this region. No TTP) present. 1+ Pitting Edema present.  ?     Legs: ? ?Skin: ?   General: Skin is warm and dry.  ?   Capillary Refill: Capillary refill takes less than 2 seconds.  ?Neurological:  ?   General: No focal deficit present.  ?   Mental Status: He is alert. Mental status is at baseline.  ?   Motor: No weakness.  ?   Coordination: Coordination normal.  ?   Gait: Gait normal.  ?Psychiatric:     ?   Mood and Affect: Mood normal.     ?   Behavior: Behavior normal.     ?   Thought Content: Thought content normal.  ? ? ? ?UC Treatments / Results  ?Labs ?(all labs ordered are listed, but only abnormal results are displayed) ?Labs Reviewed - No data to display ? ?EKG ? ? ?Radiology ?DG Tibia/Fibula Right ? ?Result Date: 05/07/2021 ?CLINICAL DATA:  Right lower leg edema and mass. EXAM: RIGHT TIBIA AND FIBULA - 2 VIEW COMPARISON:  None. FINDINGS: No fracture, dislocation or bone lesion identified. No evidence of bone destruction. Soft tissues are grossly unremarkable. IMPRESSION: Negative. Electronically Signed   By: Irish Lack M.D.   On: 05/07/2021 19:00   ? ?Procedures ?Procedures (including critical care time) ? ?Medications Ordered in UC ?Medications - No data to display ? ?Initial Impression / Assessment and Plan / UC Course  ?I have reviewed the triage vital signs and the nursing notes. ? ?Pertinent labs & imaging results that were available during my care of the patient were reviewed by me and considered in my medical decision making (see chart for  details). ? ?61 year old male presents for approximately 1  week history of an area of swelling of the right lower extremity.  Over the past day or 2 it has become painful.  Bruising present.  No injury.  Also reports pain in the buttocks for about the same amount of time.  No radiating pain, numbness/tingling or weakness.  No back pain. ? ?Patient is overall well-appearing.  On exam he does have an area of swelling with 2+ pitting edema, ecchymosis and tenderness of the right lower extremity as shown and read in the picture included in chart.  Of the opposite leg he has an area of mild swelling and 1+ pitting edema without tenderness to palpation.  No calf tenderness.  No tenderness palpation of lower back.  Full range of motion of back and negative straight leg raise.  Tenderness palpation about the piriformis on the right. ? ?X-ray obtained to assess for possible underlying bony injury.  X-ray of tib-fib reviewed by me.  Negative. ? ?Advised patient there is a possibility he could have a DVT although it is less common to have one anteriorly and he is on Plavix which does decrease his risk of it.  No history of DVT or PE.  No other real risk factors noted.  Patient does show me the area of swelling on the opposite leg that is very similar although lesser and not with contusion. ? ?I believe his buttocks pain is possibly related to sitting on his wallet all the time.  Suspect mild piriformis syndrome versus pinched nerve.  Reviewed exercises. ? ?Advised patient of RICE guidelines, Tylenol for pain.  Applied Ace wrap.  Advise close monitoring and to contact his PCP tomorrow for an appointment.  Reviewed if no improvement in condition over the next 24 to 48 hours or if worsening he needs to go to the ER for evaluation and ultrasound of his lower extremity to assess for DVT.  Feel he is low risk given that he has an area of swelling on the opposite leg that is very similar and it does occur anteriorly.  Reviewed ED  precautions with him. ? ? ?Final Clinical Impressions(s) / UC Diagnoses  ? ?Final diagnoses:  ?Pain and swelling of right lower leg  ?Right buttock pain  ?Contusion of right lower leg, initial encounter  ? ? ? ?Discharge Instructio

## 2021-05-07 NOTE — ED Triage Notes (Signed)
Pt c/o right leg swelling and mass x1week. ? ?Pt states that it is sore currently and it feels "like you're sitting on your wallet" along his hips and the sensation is extending down his leg. Pt states that it has grown larger ?

## 2021-05-07 NOTE — Discharge Instructions (Signed)
-  X-ray is normal.  Symptoms could be due to contusion related to injury did not recall.  You are on a blood thinner so it increases your risk of bleeding.  Ask ?- As we discussed, I am 50/50 on the possibility that your symptoms could be due to a blood clot in your lower leg.  The way to diagnose that is by ultrasound which we do not have in the clinic today. ?-Also as we discussed, blood clots of the anterior lower leg are less common and the fact that you are on a blood thinner does reduce your risk a little.  Is also reassuring that you do not have a history of blood clots. 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 ?- We have provided you with an Ace wrap to try to help the swelling.  I also advise that you elevate your lower leg frequently.  Tylenol for pain. ?- Continue your home medications. ?- If no improvement or worsening symptoms in the next 24 to 48 hours, you should go to the emergency department for evaluation and ultrasound of your lower extremity to rule out DVT. ?- If you ever have any chest pain/tightness, wheezing or shortness of breath you need to call 911. ?- Reach out to your PCP tomorrow to see if they are able to see you tomorrow or early next week. ?

## 2021-07-16 ENCOUNTER — Encounter: Payer: Self-pay | Admitting: Emergency Medicine

## 2021-07-16 ENCOUNTER — Ambulatory Visit
Admission: EM | Admit: 2021-07-16 | Discharge: 2021-07-16 | Disposition: A | Discharge status: Home / Self Care | Payer: Self-pay

## 2021-07-16 DIAGNOSIS — J029 Acute pharyngitis, unspecified: Secondary | ICD-10-CM

## 2021-07-16 DIAGNOSIS — J40 Bronchitis, not specified as acute or chronic: Secondary | ICD-10-CM

## 2021-07-16 MED ORDER — AMOXICILLIN-POT CLAVULANATE 875-125 MG PO TABS: 1.0000 | ORAL_TABLET | Freq: Two times a day (BID) | ORAL | 0 refills | Status: DC

## 2021-07-16 MED ORDER — FLUTICASONE PROPIONATE 50 MCG/ACT NA SUSP: 2.0000 | Freq: Every day | NASAL | 0 refills | Status: DC

## 2021-07-16 MED ORDER — ALBUTEROL SULFATE HFA 108 (90 BASE) MCG/ACT IN AERS: 2.0000 | INHALATION_SPRAY | RESPIRATORY_TRACT | 0 refills | Status: DC | PRN

## 2021-07-16 NOTE — ED Provider Notes (Signed)
[facility]    CSN: [id number] Arrival date & time: 07/16/21  6295      History   Chief Complaint Chief Complaint  Patient presents with   Sore Throat   chest congestion   Cough   Nasal Congestion    HPI Jaime Cook is a 61 y.o. male who states he vomited in his sleep 2 nights ago  and developed ST, horseless and coughing green mucous. He denies a fever. Was feeling achy yesterday and missed work and needs a note. Denies CP or SOB. His nose is stuffy and feels a sore gland on the L neck area. He denies drinking ETOH. His ears feel stuffy Had a negative covid test at home   Past Medical History:  Diagnosis Date   Heart murmur    Hypertension     Patient Active Problem List   Diagnosis Date Noted   Chest pain 11/02/2018    Past Surgical History:  Procedure Laterality Date   TONSILLECTOMY         Home Medications    Prior to Admission medications   Medication Sig Start Date End Date Taking? Authorizing Provider  albuterol (VENTOLIN HFA) 108 (90 Base) MCG/ACT inhaler Inhale 2 puffs into the lungs every 4 (four) hours as needed for wheezing or shortness of breath. 07/16/21  Yes Rodriguez-Southworth, Nettie Elm, PA-C  amoxicillin-clavulanate (AUGMENTIN) 875-125 MG tablet Take 1 tablet by mouth every 12 (twelve) hours. 07/16/21  Yes Rodriguez-Southworth, Viviana Simpler  aspirin EC 81 MG tablet Take 81 mg by mouth daily. 10/15/20 10/15/21 Yes [provider]  atorvastatin (LIPITOR) 80 MG tablet Take 1 tablet by mouth daily. 02/14/21  Yes [provider]  clopidogrel (PLAVIX) 75 MG tablet Take 75 mg by mouth daily. 02/14/21 02/14/22 Yes [provider]  fluticasone (FLONASE) 50 MCG/ACT nasal spray Place 2 sprays into both nostrils daily. 07/16/21  Yes Rodriguez-Southworth, Nettie Elm, PA-C  hydrochlorothiazide (HYDRODIURIL) 25 MG tablet Take 1 tablet by mouth daily. 12/08/15  Yes [provider]  potassium chloride (KLOR-CON) 10 MEQ tablet  Take 10 mEq by mouth daily. 06/25/20  Yes [provider]  amLODipine (NORVASC) 10 MG tablet Take 1 tablet by mouth daily.    [provider]  pantoprazole (PROTONIX) 40 MG tablet Take 40 mg by mouth daily. 07/13/21   [provider]    Family History Family History  Problem Relation Age of Onset   Hypertension Mother    Heart failure Mother    Hypertension Father    Heart failure Father    Cancer Father     Social History Social History   Tobacco Use   Smoking status: Never   Smokeless tobacco: Never  Vaping Use   Vaping Use: Never used  Substance Use Topics   Alcohol use: Never   Drug use: Never     Allergies   Patient has no known allergies.   Review of Systems Review of Systems  Constitutional:  Negative for activity change, appetite change, chills, diaphoresis and fever.  HENT:  Positive for congestion, postnasal drip, sore throat and voice change. Negative for ear discharge, ear pain, rhinorrhea and trouble swallowing.   Eyes:  Negative for discharge.  Respiratory:  Positive for cough. Negative for chest tightness, shortness of breath and wheezing.   Cardiovascular:  Negative for chest pain.  Musculoskeletal:  Positive for myalgias.  Neurological:  Negative for headaches.     Physical Exam Triage Vital Signs ED Triage Vitals  Enc Vitals Group  BP 07/16/21 0845 137/76     Pulse Rate 07/16/21 0845 69     Resp 07/16/21 0845 17     Temp 07/16/21 0845 97.7 F (36.5 C)     Temp Source 07/16/21 0845 Oral     SpO2 07/16/21 0845 97 %     Weight --      Height --      Head Circumference --      Peak Flow --      Pain Score 07/16/21 0841 4     Pain Loc --      Pain Edu? --      Excl. in GC? --    No data found.  Updated Vital Signs BP 137/76 (BP Location: Left Arm)   Pulse 69   Temp 97.7 F (36.5 C) (Oral)   Resp 17   SpO2 97%   Visual Acuity Right Eye Distance:   Left Eye Distance:   Bilateral Distance:    Right  Eye Near:   Left Eye Near:    Bilateral Near:     Physical Exam Physical Exam Constitutional:      General: He is not in acute distress.    Appearance: He is not toxic-appearing.  HENT:     Head: Normocephalic.     Right Ear: Tympanic membrane dull and gray, but ear canal and external ear normal.     Left Ear:  Tm and gray and dull, but Ear canal and external ear normal.     Nose: Nose normal.     Mouth/Throat: mild erythema and green thick mucous noted on it. No exudate. He does not have tonsils.     Mouth: Mucous membranes are moist.     Pharynx: Oropharynx is clear.  Eyes:     General: No scleral icterus.    Conjunctiva/sclera: Conjunctivae normal.  Cardiovascular:     Rate and Rhythm: Normal rate and regular rhythm.     Heart sounds: No murmur heard.   Pulmonary:     Effort: Pulmonary effort is normal. No respiratory distress.     Breath sounds: mild tracheal Wheezing present with force expirations. But lung sounds clear.     Musculoskeletal:        General: Normal range of motion.     Cervical back: Neck supple.  Lymphadenopathy:     Cervical: has slightly enlarged L upper chain cervical node which is tender, but soft and mobile.   Skin:    General: Skin is warm and dry.     Findings: No rash.  Neurological:     Mental Status: He is alert and oriented to person, place, and time.     Gait: Gait normal.  Psychiatric:        Mood and Affect: Mood normal.        Behavior: Behavior normal.        Thought Content: Thought content normal.        Judgment: Judgment normal.    UC Treatments / Results  Labs (all labs ordered are listed, but only abnormal results are displayed) Labs Reviewed - No data to display  EKG   Radiology No results found.  Procedures Procedures (including critical care time)  Medications Ordered in UC Medications - No data to display  Initial Impression / Assessment and Plan / UC Course  I have reviewed the triage vital signs and the  nursing notes.  Pharyngitis Laryngitis Bronchitis BSOM Purulent post nasal drainage  Since he has not had  a fever and lung sounds are clear as well as pulse ox being normal, I did not order a chest xray. But I placed him on Augmentin, Flonase and Albuterol as noted.   Pt advised if she gets worse to be seen again. See instructions     Final Clinical Impressions(s) / UC Diagnoses   Final diagnoses:  Bronchitis  Acute pharyngitis, unspecified etiology     Discharge Instructions      If you dont start improving in 2 days or you get worse, please go to the ER.      ED Prescriptions     Medication Sig Dispense Auth. Provider   amoxicillin-clavulanate (AUGMENTIN) 875-125 MG tablet Take 1 tablet by mouth every 12 (twelve) hours. 14 tablet Rodriguez-Southworth, Nettie Elm, PA-C   albuterol (VENTOLIN HFA) 108 (90 Base) MCG/ACT inhaler Inhale 2 puffs into the lungs every 4 (four) hours as needed for wheezing or shortness of breath. 18 g Rodriguez-Southworth, Denim Start, PA-C   fluticasone (FLONASE) 50 MCG/ACT nasal spray Place 2 sprays into both nostrils daily. 16 g Rodriguez-Southworth, Nettie Elm, PA-C      PDMP not reviewed this encounter.   Garey Ham, [place] 07/16/21 [phone]

## 2021-07-16 NOTE — Discharge Instructions (Addendum)
If you dont start improving in 2 days or you get worse, please go to the ER.

## 2021-07-16 NOTE — ED Triage Notes (Signed)
Pt reports nasal congestion, chest congestion, sore throat and coughing up green mucous. States Tuesday night he was asleep and vomited inside his mouth and swallowed it while still asleep. States symptoms started Wednesday morning.

## 2021-10-08 ENCOUNTER — Encounter: Payer: Self-pay | Admitting: Emergency Medicine

## 2021-10-08 ENCOUNTER — Ambulatory Visit
Admission: EM | Admit: 2021-10-08 | Discharge: 2021-10-08 | Disposition: A | Discharge status: Home / Self Care | Payer: Self-pay | Attending: Emergency Medicine | Admitting: Emergency Medicine

## 2021-10-08 DIAGNOSIS — R600 Localized edema: Secondary | ICD-10-CM

## 2021-10-08 DIAGNOSIS — M722 Plantar fascial fibromatosis: Secondary | ICD-10-CM

## 2021-10-08 NOTE — Discharge Instructions (Addendum)
I suspect the pain in your feet is coming from Planter fasciitis.  You can do stretching exercises for your foot and calf muscles, avoid the use of flat shoes, barefoot walking.  Use heel cups and/or arch supports.  I have referred you to Dr. Hiawatha Dressel Royalty, sports medicine for further management.  Sometimes a steroid injection can help with pain.  Or you can follow-up with [facility] in [place].  The swelling in your legs could be coming from the Norvasc that you are on.  It could also come from aortic stenosis.  Please follow-up with your cardiologist as scheduled.  Go immediately to the ER for chest pain, shortness of breath, shortness of breath with walking, or for any other concerns.

## 2021-10-08 NOTE — ED Triage Notes (Signed)
Pt presents with bilateral foot pain and swelling x 3 weeks. Pt has tried OtC pain medication with no relief.

## 2021-10-08 NOTE — ED Provider Notes (Signed)
HPI  SUBJECTIVE:  Jaime Cook is a 61 y.o. male who presents with 2 to 3 months of bilateral stabbing, burning foot pain located primarily on the plantar surface of his feet.  He states that it radiates into his Cook.  It is gotten worse over the past few weeks.  He states that the pain is worse with first heel strike, especially in the morning.  There are no alleviating factors.  He has tried Tylenol and ibuprofen.  He wears work boots, and spends a great deal of time on his feet.    He also reports 2 to 3 months of bilateral foot swelling that is worse at the end of the day.  He was started on Lasix 20 mg by his cardiologist earlier this week for this, and he is to increase it to 40 mg tomorrow.  He is also on supplemental potassium.  He is a [employer], but denies long distance driving.  He is on a low-salt diet.  No chest pain, shortness of breath, coughing, wheezing, dyspnea on exertion, orthopnea, nocturia, abdominal pain, PND.  He reports gaining 28 pounds in the past 4 months.  He has a past medical history of coronary disease status post 2 stents on Plavix, hypertension, aortic stenosis, and a cardiac murmur.  No history of diabetes, PAD/PVD, neuropathy, planter fasciitis, chronic kidney disease.  He has follow-up with cardiology and an echo scheduled next week to evaluate for worsening aortic stenosis.  He is also on a calcium channel blocker for his hypertension.  [facility].  Cardiology: [facility].   Past Medical History:  Diagnosis Date   Heart murmur    Hypertension     Past Surgical History:  Procedure Laterality Date   TONSILLECTOMY      Family History  Problem Relation Age of Onset   Hypertension Mother    Heart failure Mother    Hypertension Father    Heart failure Father    Cancer Father     Social History   Tobacco Use   Smoking status: Former    Types: Cigarettes   Smokeless tobacco: Never  Vaping Use   Vaping Use: Never used  Substance Use  Topics   Alcohol use: Never   Drug use: Never    No current facility-administered medications for this encounter.  Current Outpatient Medications:    furosemide (LASIX) 20 MG tablet, Take 2 pills a day, Disp: , Rfl:    rosuvastatin (CRESTOR) 10 MG tablet, Take 1 tablet by mouth daily., Disp: , Rfl:    albuterol (VENTOLIN HFA) 108 (90 Base) MCG/ACT inhaler, Inhale 2 puffs into the lungs every 4 (four) hours as needed for wheezing or shortness of breath., Disp: 18 g, Rfl: 0   amLODipine (NORVASC) 10 MG tablet, Take 1 tablet by mouth daily., Disp: , Rfl:    aspirin EC 81 MG tablet, Take 81 mg by mouth daily., Disp: , Rfl:    atorvastatin (LIPITOR) 80 MG tablet, Take 1 tablet by mouth daily., Disp: , Rfl:    clopidogrel (PLAVIX) 75 MG tablet, Take 75 mg by mouth daily., Disp: , Rfl:    fluticasone (FLONASE) 50 MCG/ACT nasal spray, Place 2 sprays into both nostrils daily., Disp: 16 g, Rfl: 0   hydrochlorothiazide (HYDRODIURIL) 25 MG tablet, Take 1 tablet by mouth daily., Disp: , Rfl:    losartan (COZAAR) 25 MG tablet, Take 25 mg by mouth daily., Disp: , Rfl:    pantoprazole (PROTONIX) 40 MG tablet,  Take 40 mg by mouth daily., Disp: , Rfl:    potassium chloride (KLOR-CON) 10 MEQ tablet, Take 10 mEq by mouth daily., Disp: , Rfl:   No Known Allergies   ROS  As noted in HPI.   Physical Exam  BP 123/60 (BP Location: Left Arm)   Pulse 70   Temp 97.7 F (36.5 C) (Oral)   Resp 16   SpO2 97%   Constitutional: Well developed, well nourished, no acute distress Eyes:  EOMI, conjunctiva normal bilaterally HENT: Normocephalic, atraumatic,mucus membranes moist Respiratory: Normal inspiratory effort, lungs clear bilaterally Cardiovascular: Normal rate, regular rhythm, positive systolic ejection murmur. GI: nondistended skin: No rash, skin intact Musculoskeletal: Bilateral trace to 1+ lower extremity edema to ankles.  Bilateral feet: No tenderness over the dorsum of the foot, midfoot.  Positive  tenderness over the plantar fascia, tenderness along the calcaneus at the insertion of the plantar fascia.  No tenderness over the Achilles.  Pain aggravated with dorsiflexion.  No pain with plantarflexion, inversion/eversion of the Cook.  DP 2+.  Skin intact.  Cap refill less than 2 seconds. Neurologic: Alert & oriented x 3, no focal neuro deficits Psychiatric: Speech and behavior appropriate   ED Course   Medications - No data to display  Orders Placed This Encounter  Procedures   AMB referral to sports medicine    Referral Priority:   Urgent    Referral Type:   Consultation    Referred to Provider:   Jerrol Banana, MD    Number of Visits Requested:   1    No results found for this or any previous visit (from the past 24 hour(s)). No results found.  ED Clinical Impression  1. Plantar fasciitis, bilateral   2. Bilateral lower extremity edema      ED Assessment/Plan     1.  Bilateral lower extremity edema.  He does not appear fluid overloaded.  He has no signs or symptoms of CHF other than the unintentional weight gain over the past 3 to 4 months.  He is compliant with his amlodipine.  Discussed with patient that this can contribute to lower extremity edema.  Patient is on low-dose Lasix currently.  He is to increase his Lasix to 40 mg tomorrow.  He has an echo for aortic valve evaluation, labs and cardiac follow-up scheduled for next week.  He is already on supplemental potassium.  Deferring labs today.  2.  Bilateral foot pain.  No evidence of circulatory problems.  Presentation is most consistent with a plantar fasciitis.  Doubt bilateral calcaneal stress fracture, osteomyelitis.  Will send home with stretching exercises for the plantar fascia and calf muscles, avoiding the use of flat shoes, barefoot walking, using heel cups and or arch supports.  He is on Plavix, so cannot start him on 2 to 3 weeks of NSAIDs.  Will refer to Dr. Orville Widmann Cook, sports medicine, for further  management, or he can follow-up with Jaime Cook in [place].  Discussed that this can take a prolonged period of time to resolve.  Discussed MDM, treatment plan, and plan for follow-up with patient.  patient agrees with plan.   No orders of the defined types were placed in this encounter.     *This clinic note was created using Dragon dictation software. Therefore, there may be occasional mistakes despite careful proofreading.  ?    Domenick Gong, MD 10/08/21 2029

## 2021-10-12 ENCOUNTER — Ambulatory Visit: Payer: Self-pay | Admitting: *Deleted

## 2021-10-12 ENCOUNTER — Telehealth: Payer: Self-pay

## 2021-10-12 NOTE — Telephone Encounter (Signed)
Copied from CRM [id number]. Topic: Appointment Scheduling - Scheduling Inquiry for Clinic >> Oct 12, 2021  1:07 PM De Blanch wrote: Reason for CRM: Pt is requesting an estimate for an office visit with Dr. Joseph Berkshire. Pt was seen at [facility] and was recommended to see him for Plantar fasciitis  Pt requesting a call back.

## 2021-10-12 NOTE — Telephone Encounter (Signed)
  Chief Complaint: bilateral feet and ankle swelling with worsening pain. See in UC dx with plantar fascitis Symptoms: left foot worse than right with swelling and pain. After sitting, reports difficulty starting to walk. Reports skin on feet "cracking" and OTC cream used and then noted pain in feet and legs. Hx stents cardiac. Tried tylenol with no relief. Elevating feet intermittently.  Frequency: "for a while" UC reports 2-3 months  Pertinent Negatives: Patient denies chest pain no difficulty breathing no fever no redness  Disposition: [] ED /[] Urgent Care (no appt availability in office) / [] Appointment(In office/virtual)/ []  [facility]/ [] Home Care/ [] Refused Recommended Disposition /[] [facility]/ [x]  Follow-up with PCP Additional Notes:   Requesting appt with Dr. . Referred at UC to see Dr. . No insurance and patient would like a call back to review  cost of visit. Please advise .    Reason for Disposition  MILD or MODERATE ankle swelling (e.g., can't move joint normally, can't do usual activities) (Exceptions: Itchy, localized swelling; swelling is chronic.)  Answer Assessment - Initial Assessment Questions 1. LOCATION: "Which ankle is swollen?" "Where is the swelling?"     Both left worse than right  2. ONSET: "When did the swelling start?"     "For a while" 3. SWELLING: "How bad is the swelling?" Or, "How large is it?" (e.g., mild, moderate, severe; size of localized swelling)    - NONE: No joint swelling.   - LOCALIZED: Localized; small area of puffy or swollen skin (e.g., insect bite, skin irritation).   - MILD: Joint looks or feels mildly swollen or puffy.   - MODERATE: Swollen; interferes with normal activities (e.g., work or school); decreased range of movement; may be limping.   - SEVERE: Very swollen; can't move swollen joint at all; limping a lot or unable to walk.     Swelling to feet and ankles , has been seeing cardiology  4.  PAIN: "Is there any pain?" If Yes, ask: "How bad is it?" (Scale 1-10; or mild, moderate, severe)   - NONE (0): no pain.   - MILD (1-3): doesn't interfere with normal activities.    - MODERATE (4-7): interferes with normal activities (e.g., work or school) or awakens from sleep, limping.    - SEVERE (8-10): excruciating pain, unable to do any normal activities, unable to walk.      Between 4- 7 and when putting pressure on feet gets worse 5. CAUSE: "What do you think caused the ankle swelling?"     Not sure UC dx with plantar fascitis. 6. OTHER SYMPTOMS: "Do you have any other symptoms?" (e.g., fever, chest pain, difficulty breathing, calf pain)     Skin on feet "cracking" and used OTC cream and then pain started  7. PREGNANCY: "Is there any chance you are pregnant?" "When was your last menstrual period?"     na  Protocols used: Ankle Swelling-A-AH

## 2021-10-16 ENCOUNTER — Encounter: Payer: Self-pay | Admitting: Family Medicine

## 2021-10-22 ENCOUNTER — Encounter: Payer: Self-pay | Admitting: Family Medicine

## 2021-11-05 ENCOUNTER — Encounter: Payer: Self-pay | Admitting: Family Medicine

## 2021-11-23 ENCOUNTER — Encounter: Payer: Self-pay | Admitting: Emergency Medicine

## 2021-11-23 ENCOUNTER — Ambulatory Visit
Admission: EM | Admit: 2021-11-23 | Discharge: 2021-11-23 | Disposition: A | Discharge status: Home / Self Care | Payer: Self-pay | Attending: Physician Assistant | Admitting: Physician Assistant

## 2021-11-23 DIAGNOSIS — J029 Acute pharyngitis, unspecified: Secondary | ICD-10-CM | POA: Insufficient documentation

## 2021-11-23 DIAGNOSIS — J069 Acute upper respiratory infection, unspecified: Secondary | ICD-10-CM | POA: Insufficient documentation

## 2021-11-23 DIAGNOSIS — Z1152 Encounter for screening for COVID-19: Secondary | ICD-10-CM | POA: Insufficient documentation

## 2021-11-23 DIAGNOSIS — R051 Acute cough: Secondary | ICD-10-CM | POA: Insufficient documentation

## 2021-11-23 LAB — GROUP A STREP BY PCR: Group A Strep by PCR: NOT DETECTED

## 2021-11-23 LAB — SARS CORONAVIRUS 2 BY RT PCR: SARS Coronavirus 2 by RT PCR: NEGATIVE

## 2021-11-23 NOTE — ED Provider Notes (Signed)
[facility]    CSN: [id number] Arrival date & time: 11/23/21  1901      History   Chief Complaint Chief Complaint  Patient presents with   Cough    HPI DRAYLEN LOBUE is a 61 y.o. male presenting for 2-day history of cough, congestion, sore throat, bilateral ear pain, headaches and fatigue.  Patient reports that his cough is productive of yellowish-green sputum.  He denies sinus pain, chest pain, breathing difficulty, abdominal pain, nausea/vomiting or diarrhea.  He denies any sick contacts.  Patient's wife states that she gave him a COVID test at home and it was negative.  Patient has been taking Robitussin for his cough.  He has no history of COPD or asthma.  His history is significant for hypertension and coronary artery disease.  HPI  Past Medical History:  Diagnosis Date   Heart murmur    Hypertension     Patient Active Problem List   Diagnosis Date Noted   Chest pain 11/02/2018    Past Surgical History:  Procedure Laterality Date   TONSILLECTOMY         Home Medications    Prior to Admission medications   Medication Sig Start Date End Date Taking? Authorizing Provider  albuterol (VENTOLIN HFA) 108 (90 Base) MCG/ACT inhaler Inhale 2 puffs into the lungs every 4 (four) hours as needed for wheezing or shortness of breath. Patient not taking: Reported on 11/23/2021 07/16/21   Rodriguez-Southworth, Sunday Spillers, PA-C  amLODipine (NORVASC) 10 MG tablet Take 1 tablet by mouth daily.    [provider]  atorvastatin (LIPITOR) 80 MG tablet Take 1 tablet by mouth daily. 02/14/21   [provider]  clopidogrel (PLAVIX) 75 MG tablet Take 75 mg by mouth daily. 02/14/21 02/14/22  [provider]  fluticasone (FLONASE) 50 MCG/ACT nasal spray Place 2 sprays into both nostrils daily. 07/16/21   Rodriguez-Southworth, Sunday Spillers, PA-C  furosemide (LASIX) 20 MG tablet Take 2 pills a day 10/08/21   [provider]  hydrochlorothiazide (HYDRODIURIL) 25  MG tablet Take 1 tablet by mouth daily. Patient not taking: Reported on 11/23/2021 12/08/15   [provider]  losartan (COZAAR) 25 MG tablet Take 25 mg by mouth daily. 06/12/21   [provider]  pantoprazole (PROTONIX) 40 MG tablet Take 40 mg by mouth daily. 07/13/21   [provider]  potassium chloride (KLOR-CON) 10 MEQ tablet Take 10 mEq by mouth daily. 06/25/20   [provider]  rosuvastatin (CRESTOR) 10 MG tablet Take 1 tablet by mouth daily. 09/30/21 09/30/22  [provider]    Family History Family History  Problem Relation Age of Onset   Hypertension Mother    Heart failure Mother    Hypertension Father    Heart failure Father    Cancer Father     Social History Social History   Tobacco Use   Smoking status: Former    Types: Cigarettes   Smokeless tobacco: Never  Vaping Use   Vaping Use: Never used  Substance Use Topics   Alcohol use: Never   Drug use: Never     Allergies   Patient has no known allergies.   Review of Systems Review of Systems  Constitutional:  Positive for fatigue. Negative for fever.  HENT:  Positive for congestion, ear pain, rhinorrhea and sore throat. Negative for sinus pressure and sinus pain.   Respiratory:  Positive for cough. Negative for shortness of breath and wheezing.   Cardiovascular:  Negative for chest  pain.  Gastrointestinal:  Negative for abdominal pain, diarrhea, nausea and vomiting.  Musculoskeletal:  Negative for myalgias.  Neurological:  Positive for headaches. Negative for weakness and light-headedness.  Hematological:  Negative for adenopathy.     Physical Exam Triage Vital Signs ED Triage Vitals  Enc Vitals Group     BP      Pulse      Resp      Temp      Temp src      SpO2      Weight      Height      Head Circumference      Peak Flow      Pain Score      Pain Loc      Pain Edu?      Excl. in GC?    No data found.  Updated Vital Signs BP (!) 144/71 (BP  Location: Left Arm) Comment (BP Location): large cuff  Pulse 75   Temp 98.8 F (37.1 C) (Oral)   Resp 18   SpO2 97%        Physical Exam Vitals and nursing note reviewed.  Constitutional:      General: He is not in acute distress.    Appearance: Normal appearance. He is well-developed. He is ill-appearing.  HENT:     Head: Normocephalic and atraumatic.     Right Ear: Tympanic membrane, ear canal and external ear normal.     Left Ear: Tympanic membrane, ear canal and external ear normal.     Nose: Congestion present.     Mouth/Throat:     Mouth: Mucous membranes are moist.     Pharynx: Oropharynx is clear. Posterior oropharyngeal erythema present.  Eyes:     General: No scleral icterus.    Conjunctiva/sclera: Conjunctivae normal.  Cardiovascular:     Rate and Rhythm: Normal rate and regular rhythm.     Heart sounds: Normal heart sounds.  Pulmonary:     Effort: Pulmonary effort is normal. No respiratory distress.     Breath sounds: Normal breath sounds. No wheezing, rhonchi or rales.  Musculoskeletal:     Cervical back: Neck supple.  Skin:    General: Skin is warm and dry.     Capillary Refill: Capillary refill takes less than 2 seconds.  Neurological:     General: No focal deficit present.     Mental Status: He is alert. Mental status is at baseline.     Motor: No weakness.     Gait: Gait normal.  Psychiatric:        Mood and Affect: Mood normal.        Behavior: Behavior normal.      UC Treatments / Results  Labs (all labs ordered are listed, but only abnormal results are displayed) Labs Reviewed  SARS CORONAVIRUS 2 BY RT PCR  GROUP A STREP BY PCR    EKG   Radiology No results found.  Procedures Procedures (including critical care time)  Medications Ordered in UC Medications - No data to display  Initial Impression / Assessment and Plan / UC Course  I have reviewed the triage vital signs and the nursing notes.  Pertinent labs & imaging results  that were available during my care of the patient were reviewed by me and considered in my medical decision making (see chart for details).   61 year old male presents for cough, congestion, sore throat, headaches and fatigue for the past 2 days.  No fever.  Negative  home COVID test.  Taking Robitussin for symptoms.  Vitals are stable.  Patient is mildly ill-appearing but nontoxic.  On exam he does have nasal congestion and erythema posterior pharynx with slight swelling of the posterior pharynx.  His chest is clear auscultation heart regular rate and rhythm.  Discharge strep test and COVID testing performed.  Advised patient I will contact him with any positives.  Reviewed current CDC guidelines, isolation protocol and ED precautions if COVID is positive.  Will consider treatment with antiviral medication if COVID is positive.  Advised patient if he is not hear from [place], his COVID and strep testing are negative and he has a different virus.  Offered to send in viscous lidocaine and cough medication but he declines and states he will continue with throat testing.  Offered a work note but he says that he will continue to go to work.  Patient is a Naval architect.  Reviewed return precautions.   Final Clinical Impressions(s) / UC Diagnoses   Final diagnoses:  Acute upper respiratory infection  Acute cough  Sore throat     Discharge Instructions      -Will call if COVID or strep are positive.  -If positive COVID will need to isolate 5 days and wear mask for 5 days.  I can send antiviral medication to pharmacy for you. - If strep is positive I will send antibiotics. - If those tests are both negative and you have another virus and I will continue with Robitussin, increase rest and fluids, Tylenol as needed. - Return if fever, worsening cough, breathing difficulty or not feeling better after 10 days.     ED Prescriptions   None    PDMP not reviewed this encounter.   Shirlee Latch,  PA-C 11/24/21 [phone]

## 2021-11-23 NOTE — Discharge Instructions (Signed)
-  Will call if COVID or strep are positive.  -If positive COVID will need to isolate 5 days and wear mask for 5 days.  I can send antiviral medication to pharmacy for you. - If strep is positive I will send antibiotics. - If those tests are both negative and you have another virus and I will continue with Robitussin, increase rest and fluids, Tylenol as needed. - Return if fever, worsening cough, breathing difficulty or not feeling better after 10 days.

## 2021-11-23 NOTE — ED Triage Notes (Signed)
11/21/2021 is when symptoms started.  Patient has cough, sore throat, ears hurt and headache.  Patient reports coughing up thick green phlegm.  Covid test at home was negative.    Has had robitussin

## 2021-12-10 ENCOUNTER — Ambulatory Visit: Payer: Self-pay

## 2021-12-10 NOTE — Telephone Encounter (Signed)
  Chief Complaint: bilateral foot pain Symptoms: leg pain to mid lower legs, mild swelling to feet and ankles Frequency: 4 months Pertinent Negatives: Patient denies fever, rash, numbness Disposition: [] ED /[x] Urgent Care (no appt availability in office) / [] Appointment(In office/virtual)/ []  [facility]/ [] Home Care/ [] Refused Recommended Disposition /[] [facility]/ []  Follow-up with PCP Additional Notes: Called office and spoke with . Advised her pt has had several cancellations and pt wanting new ortho consult with Dr. . Was advised to advise pt to go to [facility]. Advised pt to go to [facility]. Pt verbalized understanding. Reason for Disposition  [1] SEVERE pain (e.g., excruciating, unable to do any normal activities) AND [2] not improved after 2 hours of pain medicine  Answer Assessment - Initial Assessment Questions 1. ONSET: "When did the pain start?"      4 months 2. LOCATION: "Where is the pain located?"      Both feet left> right  3. PAIN: "How bad is the pain?"    (Scale 1-10; or mild, moderate, severe)  - MILD (1-3): doesn't interfere with normal activities.   - MODERATE (4-7): interferes with normal activities (e.g., work or school) or awakens from sleep, limping.   - SEVERE (8-10): excruciating pain, unable to do any normal activities, unable to walk.      severe 4. 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 CAUSE: "What do you think is causing the foot pain?"     Plantar fascitis 6. OTHER SYMPTOMS: "Do you have any other symptoms?" (e.g., leg pain, rash, fever, numbness)     Crack skin, leg pain half way up lower leg both legs, swelling to ankles and feet 7. PREGNANCY: "Is there any chance you are pregnant?" "When was your last menstrual period?"     N/a  Protocols used: Foot Pain-A-AH

## 2022-03-04 ENCOUNTER — Ambulatory Visit
Admission: EM | Admit: 2022-03-04 | Discharge: 2022-03-04 | Disposition: A | Discharge status: Home / Self Care | Payer: Self-pay | Attending: Emergency Medicine | Admitting: Emergency Medicine

## 2022-03-04 DIAGNOSIS — J0140 Acute pansinusitis, unspecified: Secondary | ICD-10-CM

## 2022-03-04 MED ORDER — AMOXICILLIN-POT CLAVULANATE 875-125 MG PO TABS: 1.0000 | ORAL_TABLET | Freq: Two times a day (BID) | ORAL | 0 refills | Status: AC

## 2022-03-04 MED ORDER — PROMETHAZINE-DM 6.25-15 MG/5ML PO SYRP: 5.0000 mL | ORAL_SOLUTION | Freq: Four times a day (QID) | ORAL | 0 refills | Status: DC | PRN

## 2022-03-04 MED ORDER — IPRATROPIUM BROMIDE 0.06 % NA SOLN: 2.0000 | Freq: Four times a day (QID) | NASAL | 12 refills | Status: DC

## 2022-03-04 MED ORDER — BENZONATATE 100 MG PO CAPS: 200.0000 mg | ORAL_CAPSULE | Freq: Three times a day (TID) | ORAL | 0 refills | Status: DC

## 2022-03-04 NOTE — Discharge Instructions (Signed)
The Augmentin twice daily with food for 10 days for treatment of your sinusitis.  Perform sinus irrigation 2-3 times a day with a [employer] sinus rinse kit and distilled water.  Do not use tap water.  You can use plain over-the-counter Mucinex every 6 hours to break up the stickiness of the mucus so your body can clear it.  Increase your oral fluid intake to thin out your mucus so that is also able for your body to clear more easily.  Take an over-the-counter probiotic, such as Culturelle-align-activia, 1 hour after each dose of antibiotic to prevent diarrhea.  Use the Atrovent nasal spray, 2 squirts in each nostril every 6 hours, as needed for runny nose and postnasal drip.  Use the Tessalon Perles every 8 hours during the day.  Take them with a small sip of water.  They may give you some numbness to the base of your tongue or a metallic taste in your mouth, this is normal.  Use the Promethazine DM cough syrup at bedtime for cough and congestion.  It will make you drowsy so do not take it during the day.   If you develop any new or worsening symptoms return for reevaluation or see your primary care provider.  

## 2022-03-04 NOTE — ED Provider Notes (Signed)
[facility]    CSN: [id number] Arrival date & time: 03/04/22  1830      History   Chief Complaint Chief Complaint  Patient presents with   Congestion   Otalgia   Headache    HPI Jaime Cook is a 62 y.o. male.   HPI  62 year old male here for evaluation of respiratory complaints.  The patient reports that for last 2 months he has been experiencing a frontal headache, nasal congestion with green nasal discharge, ear pain, cough that is productive for green sputum, shortness breath, and wheezing.  He has not had a fever.  Past Medical History:  Diagnosis Date   Heart murmur    Hypertension     Patient Active Problem List   Diagnosis Date Noted   Chest pain 11/02/2018    Past Surgical History:  Procedure Laterality Date   TONSILLECTOMY         Home Medications    Prior to Admission medications   Medication Sig Start Date End Date Taking? Authorizing Provider  amoxicillin-clavulanate (AUGMENTIN) 875-125 MG tablet Take 1 tablet by mouth every 12 (twelve) hours for 10 days. 03/04/22 03/14/22 Yes Margarette Canada, NP  benzonatate (TESSALON) 100 MG capsule Take 2 capsules (200 mg total) by mouth every 8 (eight) hours. 03/04/22  Yes Margarette Canada, NP  ipratropium (ATROVENT) 0.06 % nasal spray Place 2 sprays into both nostrils 4 (four) times daily. 03/04/22  Yes Margarette Canada, NP  promethazine-dextromethorphan (PROMETHAZINE-DM) 6.25-15 MG/5ML syrup Take 5 mLs by mouth 4 (four) times daily as needed. 03/04/22  Yes Margarette Canada, NP  albuterol (VENTOLIN HFA) 108 (90 Base) MCG/ACT inhaler Inhale 2 puffs into the lungs every 4 (four) hours as needed for wheezing or shortness of breath. Patient not taking: Reported on 11/23/2021 07/16/21   Rodriguez-Southworth, Sunday Spillers, PA-C  amLODipine (NORVASC) 10 MG tablet Take 1 tablet by mouth daily.    [provider]  atorvastatin (LIPITOR) 80 MG tablet Take 1 tablet by mouth daily. 02/14/21   [provider]  fluticasone  (FLONASE) 50 MCG/ACT nasal spray Place 2 sprays into both nostrils daily. 07/16/21   Rodriguez-Southworth, Sunday Spillers, PA-C  furosemide (LASIX) 20 MG tablet Take 2 pills a day 10/08/21   [provider]  hydrochlorothiazide (HYDRODIURIL) 25 MG tablet Take 1 tablet by mouth daily. Patient not taking: Reported on 11/23/2021 12/08/15   [provider]  losartan (COZAAR) 25 MG tablet Take 25 mg by mouth daily. 06/12/21   [provider]  pantoprazole (PROTONIX) 40 MG tablet Take 40 mg by mouth daily. 07/13/21   [provider]  potassium chloride (KLOR-CON) 10 MEQ tablet Take 10 mEq by mouth daily. 06/25/20   [provider]  rosuvastatin (CRESTOR) 10 MG tablet Take 1 tablet by mouth daily. 09/30/21 09/30/22  [provider]    Family History Family History  Problem Relation Age of Onset   Hypertension Mother    Heart failure Mother    Hypertension Father    Heart failure Father    Cancer Father     Social History Social History   Tobacco Use   Smoking status: Former    Types: Cigarettes   Smokeless tobacco: Never  Vaping Use   Vaping Use: Never used  Substance Use Topics   Alcohol use: Never   Drug use: Never     Allergies   Patient has no known allergies.   Review of Systems Review of Systems  HENT:  Positive for congestion, ear  pain, rhinorrhea, sinus pressure and sinus pain. Negative for sore throat.   Respiratory:  Positive for cough, shortness of breath and wheezing.   Neurological:  Positive for headaches.     Physical Exam Triage Vital Signs ED Triage Vitals  Enc Vitals Group     BP      Pulse      Resp      Temp      Temp src      SpO2      Weight      Height      Head Circumference      Peak Flow      Pain Score      Pain Loc      Pain Edu?      Excl. in GC?    No data found.  Updated Vital Signs BP 128/64 (BP Location: Left Arm)   Pulse 68   Temp 98.4 F (36.9 C) (Oral)   Resp 16   Ht 6' (1.829  m)   Wt 243 lb (110.2 kg)   SpO2 97%   BMI 32.96 kg/m   Visual Acuity Right Eye Distance:   Left Eye Distance:   Bilateral Distance:    Right Eye Near:   Left Eye Near:    Bilateral Near:     Physical Exam Vitals and nursing note reviewed.  Constitutional:      Appearance: Normal appearance. He is not ill-appearing.  HENT:     Head: Normocephalic and atraumatic.     Right Ear: Tympanic membrane, ear canal and external ear normal. There is no impacted cerumen.     Left Ear: Tympanic membrane, ear canal and external ear normal. There is no impacted cerumen.     Nose: Congestion and rhinorrhea present.     Comments: Nasal mucosa is markedly erythematous and edematous with purulent discharge in both nares.  Patient does have tenderness to compression of bilateral frontal and maxillary sinuses.    Mouth/Throat:     Mouth: Mucous membranes are moist.     Pharynx: Oropharynx is clear. Posterior oropharyngeal erythema present. No oropharyngeal exudate.     Comments: There is mild posterior pharyngeal erythema with greenish postnasal drip. Cardiovascular:     Rate and Rhythm: Normal rate and regular rhythm.     Pulses: Normal pulses.     Heart sounds: Normal heart sounds. No murmur heard.    No friction rub. No gallop.  Pulmonary:     Effort: Pulmonary effort is normal.     Breath sounds: Normal breath sounds. No wheezing, rhonchi or rales.  Musculoskeletal:     Cervical back: Normal range of motion and neck supple.  Lymphadenopathy:     Cervical: No cervical adenopathy.  Skin:    General: Skin is warm and dry.     Capillary Refill: Capillary refill takes less than 2 seconds.     Findings: No erythema or rash.  Neurological:     General: No focal deficit present.     Mental Status: He is alert and oriented to person, place, and time.  Psychiatric:        Mood and Affect: Mood normal.        Behavior: Behavior normal.        Thought Content: Thought content normal.         Judgment: Judgment normal.      UC Treatments / Results  Labs (all labs ordered are listed, but only abnormal results are displayed) Labs  Reviewed - No data to display  EKG   Radiology No results found.  Procedures Procedures (including critical care time)  Medications Ordered in UC Medications - No data to display  Initial Impression / Assessment and Plan / UC Course  I have reviewed the triage vital signs and the nursing notes.  Pertinent labs & imaging results that were available during my care of the patient were reviewed by me and considered in my medical decision making (see chart for details).   Patient is a nontoxic-appearing 62 year old male here for evaluation respiratory complaints of For the past 2 months.  He states that his symptoms started after his wife contracted influenza.  He developed respiratory symptoms soon after and they have continued ever since.  He is endorsing frontal sinus pressure and headache with purulent discharge from his nose.  He states that this intensifies when he lays down and he coughs all night and he has some wheezing as well.  He is not in any respiratory distress in the exam room and is able to speak in full sentence without dyspnea or tachypnea.  He does have purulent discharge in both nasal passages as well as tenderness to compression of frontal and maxillary sinuses.  His lungs are clear to auscultation all fields.  His exam is consistent with pansinusitis.  I will treat him with Augmentin 8 and 75 mg twice daily for 10 days.  We have discussed doing sinus irrigation help alleviate the mucus burden and aid in congestion relief.  I will also prescribe Atrovent nasal spray to help with the nasal congestion.  Tessalon Perles and Promethazine DM cough syrup for cough and congestion.  Return precautions reviewed.   Final Clinical Impressions(s) / UC Diagnoses   Final diagnoses:  Acute non-recurrent pansinusitis     Discharge  Instructions      The Augmentin twice daily with food for 10 days for treatment of your sinusitis.  Perform sinus irrigation 2-3 times a day with a [employer] sinus rinse kit and distilled water.  Do not use tap water.  You can use plain over-the-counter Mucinex every 6 hours to break up the stickiness of the mucus so your body can clear it.  Increase your oral fluid intake to thin out your mucus so that is also able for your body to clear more easily.  Take an over-the-counter probiotic, such as Culturelle-align-activia, 1 hour after each dose of antibiotic to prevent diarrhea.  Use the Atrovent nasal spray, 2 squirts in each nostril every 6 hours, as needed for runny nose and postnasal drip.  Use the Tessalon Perles every 8 hours during the day.  Take them with a small sip of water.  They may give you some numbness to the base of your tongue or a metallic taste in your mouth, this is normal.  Use the Promethazine DM cough syrup at bedtime for cough and congestion.  It will make you drowsy so do not take it during the day.  If you develop any new or worsening symptoms return for reevaluation or see your primary care provider.      ED Prescriptions     Medication Sig Dispense Auth. Provider   amoxicillin-clavulanate (AUGMENTIN) 875-125 MG tablet Take 1 tablet by mouth every 12 (twelve) hours for 10 days. 20 tablet Margarette Canada, NP   ipratropium (ATROVENT) 0.06 % nasal spray Place 2 sprays into both nostrils 4 (four) times daily. 15 mL Margarette Canada, NP   benzonatate (TESSALON) 100 MG capsule Take  2 capsules (200 mg total) by mouth every 8 (eight) hours. 21 capsule Margarette Canada, NP   promethazine-dextromethorphan (PROMETHAZINE-DM) 6.25-15 MG/5ML syrup Take 5 mLs by mouth 4 (four) times daily as needed. 118 mL Margarette Canada, NP      PDMP not reviewed this encounter.   Margarette Canada, NP 03/04/22 [phone]

## 2022-03-04 NOTE — ED Triage Notes (Signed)
Pt c/o congestion,ear pain,cough & HA x2 mon.

## 2022-08-20 ENCOUNTER — Ambulatory Visit: Payer: Self-pay | Admitting: Urology

## 2022-08-24 ENCOUNTER — Ambulatory Visit
Admission: EM | Admit: 2022-08-24 | Discharge: 2022-08-24 | Disposition: A | Discharge status: Home / Self Care | Payer: Self-pay | Attending: Emergency Medicine | Admitting: Emergency Medicine

## 2022-08-24 ENCOUNTER — Encounter: Payer: Self-pay | Admitting: Emergency Medicine

## 2022-08-24 DIAGNOSIS — A084 Viral intestinal infection, unspecified: Secondary | ICD-10-CM | POA: Insufficient documentation

## 2022-08-24 DIAGNOSIS — Z1152 Encounter for screening for COVID-19: Secondary | ICD-10-CM | POA: Insufficient documentation

## 2022-08-24 LAB — COMPREHENSIVE METABOLIC PANEL
ALT: 21 U/L (ref 0–44)
AST: 23 U/L (ref 15–41)
Albumin: 4.1 g/dL (ref 3.5–5.0)
Alkaline Phosphatase: 53 U/L (ref 38–126)
Anion gap: 10 (ref 5–15)
BUN: 22 mg/dL — ABNORMAL HIGH (ref 6–20)
CO2: 23 mmol/L (ref 22–32)
Calcium: 8.3 mg/dL — ABNORMAL LOW (ref 8.9–10.3)
Chloride: 101 mmol/L (ref 98–111)
Creatinine, Ser: 1.29 mg/dL — ABNORMAL HIGH (ref 0.61–1.24)
GFR, Estimated: >60 mL/min (ref >60)
Glucose, Bld: 92 mg/dL (ref 70–99)
Potassium: 3.5 mmol/L (ref 3.5–5.1)
Sodium: 134 mmol/L — ABNORMAL LOW (ref 135–145)
Total Bilirubin: 1 mg/dL (ref 0.3–1.2)
Total Protein: 7.8 g/dL (ref 6.5–8.1)

## 2022-08-24 LAB — CBC WITH DIFFERENTIAL/PLATELET
Abs Immature Granulocytes: 0.01 10*3/uL (ref 0.00–0.07)
Basophils Absolute: 0 10*3/uL (ref 0.0–0.1)
Basophils Relative: 0 %
Eosinophils Absolute: 0 10*3/uL (ref 0.0–0.5)
Eosinophils Relative: 1 %
HCT: 38.1 % — ABNORMAL LOW (ref 39.0–52.0)
Hemoglobin: 13 g/dL (ref 13.0–17.0)
Immature Granulocytes: 0 %
Lymphocytes Relative: 20 %
Lymphs Abs: 1.1 10*3/uL (ref 0.7–4.0)
MCH: 29.3 pg (ref 26.0–34.0)
MCHC: 34.1 g/dL (ref 30.0–36.0)
MCV: 86 fL (ref 80.0–100.0)
Monocytes Absolute: 1 10*3/uL (ref 0.1–1.0)
Monocytes Relative: 17 %
Neutro Abs: 3.5 10*3/uL (ref 1.7–7.7)
Neutrophils Relative %: 62 %
Platelets: 158 10*3/uL (ref 150–400)
RBC: 4.43 MIL/uL (ref 4.22–5.81)
RDW: 13.2 % (ref 11.5–15.5)
WBC: 5.7 10*3/uL (ref 4.0–10.5)
nRBC: 0 % (ref 0.0–0.2)

## 2022-08-24 LAB — LIPASE, BLOOD: Lipase: 32 U/L (ref 11–51)

## 2022-08-24 LAB — SARS CORONAVIRUS 2 BY RT PCR: SARS Coronavirus 2 by RT PCR: NEGATIVE

## 2022-08-24 NOTE — ED Provider Notes (Signed)
[facility]    CSN: [id number] Arrival date & time: 08/24/22  1146      History   Chief Complaint Chief Complaint  Patient presents with   Diarrhea   Abdominal Pain    HPI Jaime Cook is a 62 y.o. male.   62 year old male patient, Jaime Cook, presents to urgent care for evaluation of abdominal cramping diarrhea headache chills for 4 days.  Patient states he took 6 doses of Imodium without resolution of symptoms.  Patient denies any known sick contacts, patient denies any new foods or exposure to exotic or bad food.  Patient states he is drinking Gatorade and water without difficulty, denies vomiting.  The history is provided by the patient. No language interpreter was used.    Past Medical History:  Diagnosis Date   Heart murmur    Hypertension     Patient Active Problem List   Diagnosis Date Noted   Viral gastroenteritis 08/24/2022   Chest pain 11/02/2018    Past Surgical History:  Procedure Laterality Date   TONSILLECTOMY         Home Medications    Prior to Admission medications   Medication Sig Start Date End Date Taking? Authorizing Provider  albuterol (VENTOLIN HFA) 108 (90 Base) MCG/ACT inhaler Inhale 2 puffs into the lungs every 4 (four) hours as needed for wheezing or shortness of breath. Patient not taking: Reported on 11/23/2021 07/16/21   Rodriguez-Southworth, Nettie Elm, PA-C  amLODipine (NORVASC) 10 MG tablet Take 1 tablet by mouth daily.    [provider]  atorvastatin (LIPITOR) 80 MG tablet Take 1 tablet by mouth daily. 02/14/21   [provider]  benzonatate (TESSALON) 100 MG capsule Take 2 capsules (200 mg total) by mouth every 8 (eight) hours. 03/04/22   Becky Augusta, NP  fluticasone [facility]) 50 MCG/ACT nasal spray Place 2 sprays into both nostrils daily. 07/16/21   Rodriguez-Southworth, Nettie Elm, PA-C  furosemide (LASIX) 20 MG tablet Take 2 pills a day 10/08/21   [provider]  hydrochlorothiazide (HYDRODIURIL)  25 MG tablet Take 1 tablet by mouth daily. Patient not taking: Reported on 11/23/2021 12/08/15   [provider]  ipratropium (ATROVENT) 0.06 % nasal spray Place 2 sprays into both nostrils 4 (four) times daily. 03/04/22   Becky Augusta, NP  losartan (COZAAR) 25 MG tablet Take 25 mg by mouth daily. 06/12/21   [provider]  pantoprazole (PROTONIX) 40 MG tablet Take 40 mg by mouth daily. 07/13/21   [provider]  potassium chloride (KLOR-CON) 10 MEQ tablet Take 10 mEq by mouth daily. 06/25/20   [provider]  promethazine-dextromethorphan (PROMETHAZINE-DM) 6.25-15 MG/5ML syrup Take 5 mLs by mouth 4 (four) times daily as needed. 03/04/22   Becky Augusta, NP  rosuvastatin (CRESTOR) 10 MG tablet Take 1 tablet by mouth daily. 09/30/21 09/30/22  [provider]    Family History Family History  Problem Relation Age of Onset   Hypertension Mother    Heart failure Mother    Hypertension Father    Heart failure Father    Cancer Father     Social History Social History   Tobacco Use   Smoking status: Former    Types: Cigarettes   Smokeless tobacco: Never  Vaping Use   Vaping status: Never Used  Substance Use Topics   Alcohol use: Never   Drug use: Never     Allergies   Patient has no known allergies.   Review of Systems Review of Systems  Constitutional:  Positive for chills. Negative for fever.  Gastrointestinal:  Positive for diarrhea. Negative for nausea and vomiting.       Abdominal cramping  Neurological:  Positive for headaches.  All other systems reviewed and are negative.    Physical Exam Triage Vital Signs ED Triage Vitals  Encounter Vitals Group     BP      Systolic BP Percentile      Diastolic BP Percentile      Pulse      Resp      Temp      Temp src      SpO2      Weight      Height      Head Circumference      Peak Flow      Pain Score      Pain Loc      Pain Education      Exclude from Growth Chart    No  data found.  Updated Vital Signs BP 130/71 (BP Location: Left Arm)   Pulse 79   Temp 98.6 F (37 C) (Oral)   Resp 16   SpO2 98%   Visual Acuity Right Eye Distance:   Left Eye Distance:   Bilateral Distance:    Right Eye Near:   Left Eye Near:    Bilateral Near:     Physical Exam Vitals and nursing note reviewed.  Constitutional:      General: He is not in acute distress.    Appearance: He is well-developed.  HENT:     Head: Normocephalic and atraumatic.  Eyes:     Conjunctiva/sclera: Conjunctivae normal.  Cardiovascular:     Rate and Rhythm: Normal rate and regular rhythm.     Heart sounds: No murmur heard. Pulmonary:     Effort: Pulmonary effort is normal. No respiratory distress.     Breath sounds: Normal breath sounds.  Abdominal:     General: Bowel sounds are increased.     Palpations: Abdomen is soft.     Tenderness: There is no abdominal tenderness. There is no guarding or rebound.  Musculoskeletal:        General: No swelling.     Cervical back: Neck supple.  Skin:    General: Skin is warm and dry.     Capillary Refill: Capillary refill takes less than 2 seconds.  Neurological:     General: No focal deficit present.     Mental Status: He is alert and oriented to person, place, and time.     GCS: GCS eye subscore is 4. GCS verbal subscore is 5. GCS motor subscore is 6.  Psychiatric:        Attention and Perception: Attention normal.        Mood and Affect: Mood normal.        Speech: Speech normal.        Behavior: Behavior normal.      UC Treatments / Results  Labs (all labs ordered are listed, but only abnormal results are displayed) Labs Reviewed  COMPREHENSIVE METABOLIC PANEL - Abnormal; Notable for the following components:      Result Value   Sodium 134 (*)    BUN 22 (*)    Creatinine, Ser 1.29 (*)    Calcium 8.3 (*)    All other components within normal limits  CBC WITH DIFFERENTIAL/PLATELET - Abnormal; Notable for the following  components:   HCT 38.1 (*)    All other components within  normal limits  SARS CORONAVIRUS 2 BY RT PCR  GASTROINTESTINAL PANEL BY PCR, STOOL (REPLACES STOOL CULTURE)  LIPASE, BLOOD    EKG   Radiology No results found.  Procedures Procedures (including critical care time)  Medications Ordered in UC Medications - No data to display  Initial Impression / Assessment and Plan / UC Course  I have reviewed the triage vital signs and the nursing notes.  Pertinent labs & imaging results that were available during my care of the patient were reviewed by me and considered in my medical decision making (see chart for details).     Discussed exam findings with patient and plan of care, push fluids bland diet.  Follow-up with PCP.  Go to ER for new or worsening issues or concerns  Ddx: Viral gastroenteritis, diverticulosis, diverticulitis, food poisoning. Final Clinical Impressions(s) / UC Diagnoses   Final diagnoses:  Viral gastroenteritis     Discharge Instructions      Your covid test was negative, avoid caffeine, avoid spicy, greasy fried foods. Most likely this is a viral illness and will need to run its course. Push fluids(gatorade, jello,bland diet). Follow up with PCP in 3 days if symptoms have not improved. Go to ER for new or worsening issues or cocncerns.      ED Prescriptions   None    PDMP not reviewed this encounter.   Clancy Gourd, NP 08/24/22 [phone]

## 2022-08-24 NOTE — ED Triage Notes (Signed)
Pt presents with diarrhea, abdominal cramping, headache and chills x 4 days. Pt reports taking 6 doses of imodium with no relief.

## 2022-08-24 NOTE — Discharge Instructions (Addendum)
Your covid test was negative, avoid caffeine, avoid spicy, greasy fried foods. Most likely this is a viral illness and will need to run its course. Push fluids(gatorade, jello,bland diet). Follow up with PCP in 3 days if symptoms have not improved. Go to ER for new or worsening issues or cocncerns.

## 2022-10-22 ENCOUNTER — Ambulatory Visit
Admission: EM | Admit: 2022-10-22 | Discharge: 2022-10-22 | Disposition: A | Discharge status: Home / Self Care | Payer: Self-pay | Attending: Family Medicine | Admitting: Family Medicine

## 2022-10-22 ENCOUNTER — Ambulatory Visit (INDEPENDENT_AMBULATORY_CARE_PROVIDER_SITE_OTHER): Payer: Self-pay

## 2022-10-22 ENCOUNTER — Encounter: Payer: Self-pay | Admitting: *Deleted

## 2022-10-22 DIAGNOSIS — Z20822 Contact with and (suspected) exposure to covid-19: Secondary | ICD-10-CM | POA: Insufficient documentation

## 2022-10-22 DIAGNOSIS — J4521 Mild intermittent asthma with (acute) exacerbation: Secondary | ICD-10-CM | POA: Insufficient documentation

## 2022-10-22 DIAGNOSIS — J069 Acute upper respiratory infection, unspecified: Secondary | ICD-10-CM | POA: Insufficient documentation

## 2022-10-22 DIAGNOSIS — Z599 Problem related to housing and economic circumstances, unspecified: Secondary | ICD-10-CM

## 2022-10-22 DIAGNOSIS — Z1152 Encounter for screening for COVID-19: Secondary | ICD-10-CM | POA: Insufficient documentation

## 2022-10-22 DIAGNOSIS — R059 Cough, unspecified: Secondary | ICD-10-CM

## 2022-10-22 DIAGNOSIS — B9789 Other viral agents as the cause of diseases classified elsewhere: Secondary | ICD-10-CM | POA: Insufficient documentation

## 2022-10-22 DIAGNOSIS — Z5986 Financial insecurity: Secondary | ICD-10-CM | POA: Insufficient documentation

## 2022-10-22 DIAGNOSIS — Z87891 Personal history of nicotine dependence: Secondary | ICD-10-CM | POA: Insufficient documentation

## 2022-10-22 LAB — SARS CORONAVIRUS 2 BY RT PCR: SARS Coronavirus 2 by RT PCR: NEGATIVE

## 2022-10-22 MED ORDER — ALBUTEROL SULFATE HFA 108 (90 BASE) MCG/ACT IN AERS: 2.0000 | INHALATION_SPRAY | RESPIRATORY_TRACT | 0 refills | Status: DC | PRN

## 2022-10-22 MED ORDER — PREDNISONE 20 MG PO TABS: 40.0000 mg | ORAL_TABLET | Freq: Every day | ORAL | 0 refills | Status: AC

## 2022-10-22 MED ORDER — IPRATROPIUM-ALBUTEROL 0.5-2.5 (3) MG/3ML IN SOLN
3.0000 mL | Freq: Once | RESPIRATORY_TRACT | Status: AC
  Administered 2022-10-22: 3 mL via RESPIRATORY_TRACT

## 2022-10-22 NOTE — Discharge Instructions (Addendum)
Your COVID test is negative. Stop by the pharmacy to pick up your prescriptions. Use [facility] to help with prescription costs.  Follow up with your primary care provider as needed.    You can take Tylenol and/or Ibuprofen as needed for fever reduction and pain relief.    For cough: honey 1/2 to 1 teaspoon (you can dilute the honey in water or another fluid).  You can also use guaifenesin and dextromethorphan for cough. You can use a humidifier for chest congestion and cough.  If you don't have a humidifier, you can sit in the bathroom with the hot shower running.      For sore throat: try warm salt water gargles, Mucinex sore throat cough drops or cepacol lozenges, throat spray, warm tea or water with lemon/honey, popsicles or ice, or OTC cold relief medicine for throat discomfort. You can also purchase chloraseptic spray at the pharmacy or dollar store.   For congestion: take a daily anti-histamine like Zyrtec, Claritin, and a oral decongestant, such as pseudoephedrine.  You can also use Flonase 1-2 sprays in each nostril daily. Afrin is also a good option, if you do not have high blood pressure.    It is important to stay hydrated: drink plenty of fluids (water, gatorade/powerade/pedialyte, juices, or teas) to keep your throat moisturized and help further relieve irritation/discomfort.    Return or go to the Emergency Department if symptoms worsen or do not improve in the next few days

## 2022-10-22 NOTE — ED Provider Notes (Signed)
[facility]    CSN: [id number] Arrival date & time: 10/22/22  1735      History   Chief Complaint Chief Complaint  Patient presents with   Shortness of Breath        Nasal Congestion    HPI Jaime Cook is a 62 y.o. male.   HPI  History obtained from {source of history:310783}. Jaime Cook presents for nasal congestion, cough and shortness of breath that started last night after getting off work. Had fever last night 100.1 F but didn't let up finish before he took it out.  Has chills.  Has dry cough and nausea.  Has been dry heaving.  Felt like he couldn't breathe and has a lot of post-nasal drip.  Wife started feeling bad on Sunday. Their daughter has COVID. He is eating and drinking. No diarrhea but has some constipation. Has told he had asthma years ago.  Former smoker but quit 3 years ago.        Past Medical History:  Diagnosis Date   Heart murmur    Hypertension     Patient Active Problem List   Diagnosis Date Noted   Viral gastroenteritis 08/24/2022   Chest pain 11/02/2018    Past Surgical History:  Procedure Laterality Date   TONSILLECTOMY         Home Medications    Prior to Admission medications   Medication Sig Start Date End Date Taking? Authorizing Provider  FLUoxetine (PROZAC) 20 MG capsule TAKE ONE CAPSULE BY MOUTH ONCE DAILY FOR MOOD 09/26/20  Yes [provider]  albuterol (VENTOLIN HFA) 108 (90 Base) MCG/ACT inhaler Inhale 2 puffs into the lungs every 4 (four) hours as needed for wheezing or shortness of breath. Patient not taking: Reported on 11/23/2021 07/16/21   Rodriguez-Southworth, Nettie Elm, PA-C  amLODipine (NORVASC) 10 MG tablet Take 1 tablet by mouth daily.    [provider]  atorvastatin (LIPITOR) 80 MG tablet Take 1 tablet by mouth daily. 02/14/21   [provider]  benzonatate (TESSALON) 100 MG capsule Take 2 capsules (200 mg total) by mouth every 8 (eight) hours. 03/04/22   Becky Augusta, NP   fluticasone [facility]) 50 MCG/ACT nasal spray Place 2 sprays into both nostrils daily. 07/16/21   Rodriguez-Southworth, Nettie Elm, PA-C  furosemide (LASIX) 20 MG tablet Take 2 pills a day 10/08/21   [provider]  hydrochlorothiazide (HYDRODIURIL) 25 MG tablet Take 1 tablet by mouth daily. Patient not taking: Reported on 11/23/2021 12/08/15   [provider]  ipratropium (ATROVENT) 0.06 % nasal spray Place 2 sprays into both nostrils 4 (four) times daily. 03/04/22   Becky Augusta, NP  losartan (COZAAR) 25 MG tablet Take 25 mg by mouth daily. 06/12/21   [provider]  pantoprazole (PROTONIX) 40 MG tablet Take 40 mg by mouth daily. 07/13/21   [provider]  potassium chloride (KLOR-CON) 10 MEQ tablet Take 10 mEq by mouth daily. 06/25/20   [provider]  promethazine-dextromethorphan (PROMETHAZINE-DM) 6.25-15 MG/5ML syrup Take 5 mLs by mouth 4 (four) times daily as needed. 03/04/22   Becky Augusta, NP  rosuvastatin (CRESTOR) 10 MG tablet Take 1 tablet by mouth daily. 09/30/21 09/30/22  [provider]    Family History Family History  Problem Relation Age of Onset   Hypertension Mother    Heart failure Mother    Hypertension Father    Heart failure Father    Cancer Father     Social History Social History  Tobacco Use   Smoking status: Former    Types: Cigarettes   Smokeless tobacco: Never  Vaping Use   Vaping status: Never Used  Substance Use Topics   Alcohol use: Never   Drug use: Never     Allergies   Patient has no known allergies.   Review of Systems Review of Systems: negative unless otherwise stated in HPI.      Physical Exam Triage Vital Signs ED Triage Vitals  Encounter Vitals Group     BP 10/22/22 1749 (!) 143/70     Systolic BP Percentile --      Diastolic BP Percentile --      Pulse Rate 10/22/22 1749 75     Resp --      Temp 10/22/22 1749 98.4 F (36.9 C)     Temp Source 10/22/22 1749 Oral     SpO2  10/22/22 1749 93 %     Weight 10/22/22 1746 240 lb (108.9 kg)     Height 10/22/22 1746 6' (1.829 m)     Head Circumference --      Peak Flow --      Pain Score 10/22/22 1746 5     Pain Loc --      Pain Education --      Exclude from Growth Chart --    No data found.  Updated Vital Signs BP (!) 143/70 (BP Location: Right Arm)   Pulse 75   Temp 98.4 F (36.9 C) (Oral)   Ht 6' (1.829 m)   Wt 108.9 kg   SpO2 93%   BMI 32.55 kg/m   Visual Acuity Right Eye Distance:   Left Eye Distance:   Bilateral Distance:    Right Eye Near:   Left Eye Near:    Bilateral Near:     Physical Exam GEN:     alert, non-toxic appearing male in no distress ***   HENT:  mucus membranes moist, oropharyngeal ***without lesions or ***erythema, no*** tonsillar hypertrophy or exudates, *** moderate erythematous edematous turbinates, ***clear nasal discharge, ***bilateral TM normal EYES:   pupils equal and reactive, ***no scleral injection or discharge NECK:  normal ROM, no ***lymphadenopathy, ***no meningismus   RESP:  no increased work of breathing, ***clear to auscultation bilaterally CVS:   regular rate ***and rhythm Skin:   warm and dry, no rash on visible skin***    UC Treatments / Results  Labs (all labs ordered are listed, but only abnormal results are displayed) Labs Reviewed  SARS CORONAVIRUS 2 BY RT PCR    EKG   Radiology No results found.  Procedures Procedures (including critical care time)  Medications Ordered in UC Medications - No data to display  Initial Impression / Assessment and Plan / UC Course  I have reviewed the triage vital signs and the nursing notes.  Pertinent labs & imaging results that were available during my care of the patient were reviewed by me and considered in my medical decision making (see chart for details).       Pt is a 62 y.o. male who presents for *** days of respiratory symptoms. Freda is ***afebrile here without recent antipyretics.  Satting well on room air. Overall pt is ***non-toxic appearing, well hydrated, without respiratory distress. Pulmonary exam ***is unremarkable.  COVID testing obtained ***and was negative. ***Pt to quarantine until COVID test results or longer if positive.  I will call patient with test results, if positive. History consistent with ***viral respiratory illness. Discussed symptomatic treatment.  Explained lack of efficacy of antibiotics in viral disease.  Typical duration of symptoms discussed.   Return and ED precautions given and voiced understanding. Discussed MDM, treatment plan and plan for follow-up with patient*** who agrees with plan.     Final Clinical Impressions(s) / UC Diagnoses   Final diagnoses:  None   Discharge Instructions   None    ED Prescriptions   None    PDMP not reviewed this encounter.

## 2022-10-22 NOTE — ED Triage Notes (Signed)
Patient states cough/congestion since yesterday, temp 100.1 yesterday but took thermometer out before beep.  No OTC meds

## 2022-11-23 ENCOUNTER — Ambulatory Visit (INDEPENDENT_AMBULATORY_CARE_PROVIDER_SITE_OTHER): Payer: Self-pay | Admitting: Urology

## 2022-11-23 ENCOUNTER — Encounter: Payer: Self-pay | Admitting: Urology

## 2022-11-23 VITALS — BP 124/69 | HR 66 | Ht 72.0 in | Wt 239.0 lb

## 2022-11-23 DIAGNOSIS — F524 Premature ejaculation: Secondary | ICD-10-CM

## 2022-11-23 DIAGNOSIS — N529 Male erectile dysfunction, unspecified: Secondary | ICD-10-CM

## 2022-11-23 MED ORDER — TADALAFIL 20 MG PO TABS: 20.0000 mg | ORAL_TABLET | Freq: Every day | ORAL | 11 refills | Status: DC

## 2022-11-23 NOTE — Progress Notes (Signed)
   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  Comorbidities include obesity with BMI of 32, depression on Prozac, and hypertension.  He has tried sildenafil 100 mg on demand and Cialis 5 mg daily.  No prior testosterone values to review.  He has difficulty obtaining and maintaining erections.  He also has problems with premature ejaculation that sound correlated with the ED.   PMH: Past Medical History:  Diagnosis Date   Heart murmur    Hypertension     Surgical History: Past Surgical History:  Procedure Laterality Date   TONSILLECTOMY     Family History: Family History  Problem Relation Age of Onset   Hypertension Mother    Heart failure Mother    Hypertension Father    Heart failure Father    Cancer Father     Social History:  reports that he has quit smoking. His smoking use included cigarettes. He has been exposed to tobacco smoke. He has never used smokeless tobacco. He reports that he does not drink alcohol and does not use drugs.  Physical Exam: BP 124/69   Pulse 66   Ht 6' (1.829 m)   Wt 239 lb (108.4 kg)   BMI 32.41 kg/m    Constitutional:  Alert and oriented, No acute distress. Cardiovascular: No clubbing, cyanosis, or edema. Respiratory: Normal respiratory effort, no increased work of breathing. GI: Abdomen is soft, nontender, nondistended, no abdominal masses  Assessment & Plan:   62 year old male with erectile dysfunction and premature ejaculation.  It sounds like premature ejaculation is associated with his erectile dysfunction.  I recommended trying max dose 20 mg Cialis daily, and risks and benefits were discussed, I also recommended checking a morning testosterone.  We reviewed the AUA guidelines regarding evaluation and management at length.  Trial of Cialis 20 mg daily, check  morning testosterone and contact with results   [name], MD 11/23/2022  [facility] [address] [place], [place] [address] [phone]

## 2022-11-23 NOTE — Patient Instructions (Signed)

## 2022-11-24 ENCOUNTER — Other Ambulatory Visit
Admission: RE | Admit: 2022-11-24 | Discharge: 2022-11-24 | Disposition: A | Discharge status: Home / Self Care | Payer: Self-pay | Attending: Urology | Admitting: Urology

## 2022-11-24 DIAGNOSIS — N529 Male erectile dysfunction, unspecified: Secondary | ICD-10-CM | POA: Insufficient documentation

## 2022-11-25 ENCOUNTER — Other Ambulatory Visit: Payer: Self-pay

## 2022-11-25 ENCOUNTER — Other Ambulatory Visit: Payer: Self-pay | Admitting: *Deleted

## 2022-11-25 DIAGNOSIS — E349 Endocrine disorder, unspecified: Secondary | ICD-10-CM

## 2022-11-25 DIAGNOSIS — N529 Male erectile dysfunction, unspecified: Secondary | ICD-10-CM

## 2022-11-25 LAB — TESTOSTERONE: Testosterone: 277 ng/dL (ref 264–916)

## 2022-12-08 NOTE — Progress Notes (Signed)
12/13/2022 11:18 AM   Jaime Cook October 28, 1960 [id number]  Referring provider: Inc, [facility] [address] [place],  [place] [address]  Urological history: 1.  Erectile dysfunction -Contributing factors of age, obesity, depression, hypertension, history of smoking, CAD and HLD -Testosterone level (11/2022) - 277 -Failed sildenafil 100 mg on demand dosing -Failed Cialis 5 mg daily  2. Premature ejaculation  Chief Complaint  Patient presents with   Hypogonadism   HPI: Jaime Cook is a 62 y.o. male who presents today for follow up.    Previous records reviewed.   Repeat testosterone was 317.  SHIM 9  He is taking the tadalafil 20 mg at night and is waking up with a very firm erection.  He has not tried intercourse with his wife yet because she is not a morning person.  Patient still having spontaneous erections.  He denies any pain or curvature with erections.     SHIM     Row Name 12/13/22 1101         SHIM: Over the last 6 months:   How do you rate your confidence that you could get and keep an erection? Very Low     When you had erections with sexual stimulation, how often were your erections hard enough for penetration (entering your partner)? A Few Times (much less than half the time)     During sexual intercourse, how often were you able to maintain your erection after you had penetrated (entered) your partner? A Few Times (much less than half the time)     During sexual intercourse, how difficult was it to maintain your erection to completion of intercourse? Very Difficult     When you attempted sexual intercourse, how often was it satisfactory for you? A Few Times (much less than half the time)       SHIM Total Score   SHIM 9              Score: 1-7 Severe ED 8-11 Moderate ED 12-16 Mild-Moderate ED 17-21 Mild ED 22-25 No ED   I PSS 9/2  No urinary complaints.  Patient denies any modifying or aggravating factors.  Patient denies any  recent UTI's, gross hematuria, dysuria or suprapubic/flank pain.  Patient denies any fevers, chills, nausea or vomiting.     IPSS     Row Name 12/13/22 1100         International Prostate Symptom Score   How often have you had the sensation of not emptying your bladder? Not at All     How often have you had to urinate less than every two hours? Less than half the time     How often have you found you stopped and started again several times when you urinated? Less than half the time     How often have you found it difficult to postpone urination? Less than half the time     How often have you had a weak urinary stream? Less than half the time     How often have you had to strain to start urination? Not at All     How many times did you typically get up at night to urinate? 1 Time     Total IPSS Score 9       Quality of Life due to urinary symptoms   If you were to spend the rest of your life with your urinary condition just the way it is now how  would you feel about that? Mostly Satisfied              Score:  1-7 Mild 8-19 Moderate 20-35 Severe    PMH: Past Medical History:  Diagnosis Date   Heart murmur    Hypertension     Surgical History: Past Surgical History:  Procedure Laterality Date   TONSILLECTOMY      Home Medications:  Allergies as of 12/13/2022   No Known Allergies      Medication List        Accurate as of December 13, 2022 11:18 AM. If you have any questions, ask your nurse or doctor.          amLODipine 10 MG tablet Commonly known as: NORVASC Take 1 tablet by mouth daily.   aspirin 81 MG chewable tablet Chew 81 mg by mouth daily.   atorvastatin 80 MG tablet Commonly known as: LIPITOR Take 1 tablet by mouth daily.   citalopram 20 MG tablet Commonly known as: CELEXA Take 20 mg by mouth daily.   clomiPHENE 50 MG tablet Commonly known as: CLOMID Take 1/2 tablet daily Started by: Michiel Cowboy   clopidogrel 75 MG  tablet Commonly known as: PLAVIX Take 75 mg by mouth daily.   FLUoxetine 20 MG capsule Commonly known as: PROZAC TAKE ONE CAPSULE BY MOUTH ONCE DAILY FOR MOOD   furosemide 20 MG tablet Commonly known as: LASIX Take 2 pills a day   losartan 25 MG tablet Commonly known as: COZAAR Take 25 mg by mouth daily.   metoprolol succinate 25 MG 24 hr tablet Commonly known as: TOPROL-XL Take 25 mg by mouth daily.   pantoprazole 40 MG tablet Commonly known as: PROTONIX Take 40 mg by mouth daily.   potassium chloride 10 MEQ tablet Commonly known as: KLOR-CON Take 10 mEq by mouth daily.   rosuvastatin 10 MG tablet Commonly known as: CRESTOR Take 1 tablet by mouth daily.   sildenafil 100 MG tablet Commonly known as: VIAGRA Take 100 mg by mouth as needed for erectile dysfunction.   tadalafil 20 MG tablet Commonly known as: CIALIS Take 20 mg by mouth daily. What changed: Another medication with the same name was removed. Continue taking this medication, and follow the directions you see here. Changed by: Michiel Cowboy   Vitamin D-3 125 MCG (5000 UT) Tabs Take by mouth.        Allergies: No Known Allergies  Family History: Family History  Problem Relation Age of Onset   Hypertension Mother    Heart failure Mother    Hypertension Father    Heart failure Father    Cancer Father     Social History:  reports that he has quit smoking. His smoking use included cigarettes. He has been exposed to tobacco smoke. He has never used smokeless tobacco. He reports that he does not drink alcohol and does not use drugs.  ROS: Pertinent ROS in HPI  Physical Exam: BP 125/69 (BP Location: Left Arm, Patient Position: Sitting, Cuff Size: Large)   Pulse 73   Ht 6' (1.829 m)   Wt 239 lb (108.4 kg)   BMI 32.41 kg/m   Constitutional:  Well nourished. Alert and oriented, No acute distress. HEENT: [place] AT, moist mucus membranes.  Trachea midline Cardiovascular: No clubbing, cyanosis, or  edema. Respiratory: Normal respiratory effort, no increased work of breathing. Neurologic: Grossly intact, no focal deficits, moving all 4 extremities. Psychiatric: Normal mood and affect.  Laboratory Data: Lab Results  Component Value  Date   WBC 5.7 08/24/2022   HGB 12.8 (L) 12/09/2022   HCT 38.2 (L) 12/09/2022   MCV 86.0 08/24/2022   PLT 158 08/24/2022    Lab Results  Component Value Date   CREATININE 1.29 (H) 08/24/2022    Lab Results  Component Value Date   TESTOSTERONE 317 12/09/2022    Lab Results  Component Value Date   AST 23 08/24/2022   Lab Results  Component Value Date   ALT 21 08/24/2022  I have reviewed the labs.   Pertinent Imaging: N/A  Assessment & Plan:    1. Testosterone deficiency -explained that the diagnosis of testosterone deficiency/hypogonadism requires two morning testosterones at least two days apart below 300 to meet criteria - he has not met criteria  -explained that TRT is not a treatment for ED, he may see some improvement in his erections, but his ED will likely persist even with therapeutic levels of testosterone -Significant symptoms -We did discuss the options of starting Clomid in an effort to stimulate his natural testosterone production and he is in agreement -Prescription sent for Clomid 50 mg 1/2 tablet daily sent to pharmacy  2. BPH with LU TS -At goal -Continue conservative management  3. Erectile Dysfunction -He will start taking tadalafil 20 mg daily in the a.m.   Return in about 1 month (around 01/12/2023) for testosterone, SHIM.  These notes generated with voice recognition software. I apologize for typographical errors.  Cloretta Ned  [facility] [address]  [address] [place], [place] [address] [phone]

## 2022-12-09 ENCOUNTER — Other Ambulatory Visit
Admission: RE | Admit: 2022-12-09 | Discharge: 2022-12-09 | Disposition: A | Discharge status: Home / Self Care | Payer: Self-pay | Attending: Urology | Admitting: Urology

## 2022-12-09 DIAGNOSIS — E349 Endocrine disorder, unspecified: Secondary | ICD-10-CM

## 2022-12-09 DIAGNOSIS — N529 Male erectile dysfunction, unspecified: Secondary | ICD-10-CM

## 2022-12-09 LAB — HEMOGLOBIN AND HEMATOCRIT, BLOOD
HCT: 38.2 % — ABNORMAL LOW (ref 39.0–52.0)
Hemoglobin: 12.8 g/dL — ABNORMAL LOW (ref 13.0–17.0)

## 2022-12-09 LAB — PSA: Prostatic Specific Antigen: 0.46 ng/mL (ref 0.00–4.00)

## 2022-12-10 LAB — LUTEINIZING HORMONE: LH: 3 m[IU]/mL (ref 1.7–8.6)

## 2022-12-10 LAB — TESTOSTERONE: Testosterone: 317 ng/dL (ref 264–916)

## 2022-12-13 ENCOUNTER — Ambulatory Visit (INDEPENDENT_AMBULATORY_CARE_PROVIDER_SITE_OTHER): Payer: Self-pay | Admitting: Urology

## 2022-12-13 ENCOUNTER — Encounter: Payer: Self-pay | Admitting: Urology

## 2022-12-13 VITALS — BP 125/69 | HR 73 | Ht 72.0 in | Wt 239.0 lb

## 2022-12-13 DIAGNOSIS — N138 Other obstructive and reflux uropathy: Secondary | ICD-10-CM

## 2022-12-13 DIAGNOSIS — N529 Male erectile dysfunction, unspecified: Secondary | ICD-10-CM

## 2022-12-13 DIAGNOSIS — N401 Enlarged prostate with lower urinary tract symptoms: Secondary | ICD-10-CM

## 2022-12-13 DIAGNOSIS — E291 Testicular hypofunction: Secondary | ICD-10-CM

## 2022-12-13 DIAGNOSIS — R7989 Other specified abnormal findings of blood chemistry: Secondary | ICD-10-CM

## 2022-12-13 MED ORDER — CLOMIPHENE CITRATE 50 MG PO TABS: ORAL_TABLET | ORAL | 3 refills | Status: DC

## 2022-12-14 ENCOUNTER — Telehealth: Payer: Self-pay | Admitting: Urology

## 2022-12-14 NOTE — Telephone Encounter (Signed)
Patient called and stated that he does not have insurance and can't afford 289.00 per month for Clomid. He asked if there is another medication that can be prescribed that is less expensive.

## 2022-12-16 NOTE — Telephone Encounter (Signed)
Spoke with patient yesterday and she will try goodrx for $159.00

## 2023-01-30 ENCOUNTER — Other Ambulatory Visit: Payer: Self-pay | Admitting: Urology

## 2023-01-30 DIAGNOSIS — R7989 Other specified abnormal findings of blood chemistry: Secondary | ICD-10-CM

## 2023-01-30 NOTE — Progress Notes (Deleted)
 02/07/2023 8:07 AM   Jaime Cook 04-23-1960 [id number]  Referring provider: Inc, [facility] [address] [place],  [place] [address]  Urological history: 1.  Erectile dysfunction -Contributing factors of age, obesity, depression, hypertension, history of smoking, CAD and HLD -Testosterone level (11/2022) - 277 -Failed sildenafil 100 mg on demand dosing -Failed Cialis 5 mg daily  2. Premature ejaculation  No chief complaint on file.  HPI: Jaime Cook is a 62 y.o. male who presents today for one month follow up.    Previous records reviewed.   At his visit on 12/13/2022, Repeat testosterone was 317.  SHIM 9.  He is taking the tadalafil 20 mg at night and is waking up with a very firm erection.  He has not tried intercourse with his wife yet because she is not a morning person.  Patient still having spontaneous erections.  He denies any pain or curvature with erections.   I PSS 9/2.  No urinary complaints.  Patient denies any modifying or aggravating factors.  Patient denies any recent UTI's, gross hematuria, dysuria or suprapubic/flank pain.  Patient denies any fevers, chills, nausea or vomiting.   We started Clomid 50 mg, 1/2 daily and taking tadalafil 20 mg daily.    SHIM ***    Score: 1-7 Severe ED 8-11 Moderate ED 12-16 Mild-Moderate ED 17-21 Mild ED 22-25 No ED   PMH: Past Medical History:  Diagnosis Date   Heart murmur    Hypertension     Surgical History: Past Surgical History:  Procedure Laterality Date   TONSILLECTOMY      Home Medications:  Allergies as of 02/07/2023   No Known Allergies      Medication List        Accurate as of January 30, 2023  8:07 AM. If you have any questions, ask your nurse or doctor.          amLODipine 10 MG tablet Commonly known as: NORVASC Take 1 tablet by mouth daily.   aspirin 81 MG chewable tablet Chew 81 mg by mouth daily.   atorvastatin 80 MG tablet Commonly known as: LIPITOR Take 1  tablet by mouth daily.   citalopram 20 MG tablet Commonly known as: CELEXA Take 20 mg by mouth daily.   clomiPHENE 50 MG tablet Commonly known as: CLOMID Take 1/2 tablet daily   clopidogrel 75 MG tablet Commonly known as: PLAVIX Take 75 mg by mouth daily.   FLUoxetine 20 MG capsule Commonly known as: PROZAC TAKE ONE CAPSULE BY MOUTH ONCE DAILY FOR MOOD   furosemide 20 MG tablet Commonly known as: LASIX Take 2 pills a day   losartan 25 MG tablet Commonly known as: COZAAR Take 25 mg by mouth daily.   metoprolol succinate 25 MG 24 hr tablet Commonly known as: TOPROL-XL Take 25 mg by mouth daily.   pantoprazole 40 MG tablet Commonly known as: PROTONIX Take 40 mg by mouth daily.   potassium chloride 10 MEQ tablet Commonly known as: KLOR-CON Take 10 mEq by mouth daily.   rosuvastatin 10 MG tablet Commonly known as: CRESTOR Take 1 tablet by mouth daily.   sildenafil 100 MG tablet Commonly known as: VIAGRA Take 100 mg by mouth as needed for erectile dysfunction.   tadalafil 20 MG tablet Commonly known as: CIALIS Take 20 mg by mouth daily.   Vitamin D-3 125 MCG (5000 UT) Tabs Take by mouth.        Allergies: No Known Allergies  Family History: Family History  Problem Relation Age of Onset   Hypertension Mother    Heart failure Mother    Hypertension Father    Heart failure Father    Cancer Father     Social History:  reports that he has quit smoking. His smoking use included cigarettes. He has been exposed to tobacco smoke. He has never used smokeless tobacco. He reports that he does not drink alcohol and does not use drugs.  ROS: Pertinent ROS in HPI  Physical Exam: There were no vitals taken for this visit.  Constitutional:  Well nourished. Alert and oriented, No acute distress. HEENT: [place] AT, moist mucus membranes.  Trachea midline, no masses. Cardiovascular: No clubbing, cyanosis, or edema. Respiratory: Normal respiratory effort, no increased  work of breathing. GI: Abdomen is soft, non tender, non distended, no abdominal masses. Liver and spleen not palpable.  No hernias appreciated.  Stool sample for occult testing is not indicated.   GU: No CVA tenderness.  No bladder fullness or masses.  Patient with circumcised/uncircumcised phallus. ***Foreskin easily retracted***  Urethral meatus is patent.  No penile discharge. No penile lesions or rashes. Scrotum without lesions, cysts, rashes and/or edema.  Testicles are located scrotally bilaterally. No masses are appreciated in the testicles. Left and right epididymis are normal. Rectal: Patient with  normal sphincter tone. Anus and perineum without scarring or rashes. No rectal masses are appreciated. Prostate is approximately *** grams, *** nodules are appreciated. Seminal vesicles are normal. Skin: No rashes, bruises or suspicious lesions. Lymph: No cervical or inguinal adenopathy. Neurologic: Grossly intact, no focal deficits, moving all 4 extremities. Psychiatric: Normal mood and affect.   Laboratory Data: Pending    Pertinent Imaging: N/A  Assessment & Plan:    1. Testosterone deficiency -testosterone level is pending  2. Erectile Dysfunction -He will start taking tadalafil 20 mg daily in the a.m.   No follow-ups on file.  These notes generated with voice recognition software. I apologize for typographical errors.  Cloretta Ned  [facility] [address]  [address] [place], [place] [address] [phone]

## 2023-02-07 ENCOUNTER — Ambulatory Visit: Payer: Self-pay | Admitting: Urology

## 2023-02-07 DIAGNOSIS — N529 Male erectile dysfunction, unspecified: Secondary | ICD-10-CM

## 2023-02-07 DIAGNOSIS — E349 Endocrine disorder, unspecified: Secondary | ICD-10-CM

## 2023-02-11 ENCOUNTER — Other Ambulatory Visit
Admission: RE | Admit: 2023-02-11 | Discharge: 2023-02-11 | Disposition: A | Discharge status: Home / Self Care | Payer: Self-pay | Attending: Urology | Admitting: Urology

## 2023-02-11 ENCOUNTER — Other Ambulatory Visit: Payer: Self-pay

## 2023-02-11 DIAGNOSIS — R7989 Other specified abnormal findings of blood chemistry: Secondary | ICD-10-CM | POA: Insufficient documentation

## 2023-02-11 NOTE — Progress Notes (Deleted)
 02/14/2023 10:32 AM   Jaime Cook 06/30/1960 [id number]  Referring provider: Inc, [facility] [address] [place],  [place] [address]  Urological history: 1.  Erectile dysfunction -Contributing factors of age, obesity, depression, hypertension, history of smoking, CAD and HLD -Testosterone  level (11/2022) - 277 -Failed sildenafil 100 mg on demand dosing -Failed Cialis  5 mg daily  2. Premature ejaculation  No chief complaint on file.  HPI: Jaime Cook is a 63 y.o. male who presents today for one month follow up.    Previous records reviewed.   At his visit on 12/13/2022, Repeat testosterone  was 317.  SHIM 9.  He is taking the tadalafil  20 mg at night and is waking up with a very firm erection.  He has not tried intercourse with his wife yet because she is not a morning person.  Patient still having spontaneous erections.  He denies any pain or curvature with erections.   I PSS 9/2.  No urinary complaints.  Patient denies any modifying or aggravating factors.  Patient denies any recent UTI's, gross hematuria, dysuria or suprapubic/flank pain.  Patient denies any fevers, chills, nausea or vomiting.   We started Clomid  50 mg, 1/2 daily and taking tadalafil  20 mg daily.    SHIM ***    Score: 1-7 Severe ED 8-11 Moderate ED 12-16 Mild-Moderate ED 17-21 Mild ED 22-25 No ED   PMH: Past Medical History:  Diagnosis Date   Heart murmur    Hypertension     Surgical History: Past Surgical History:  Procedure Laterality Date   TONSILLECTOMY      Home Medications:  Allergies as of 02/14/2023   No Known Allergies      Medication List        Accurate as of February 11, 2023 10:32 AM. If you have any questions, ask your nurse or doctor.          amLODipine 10 MG tablet Commonly known as: NORVASC Take 1 tablet by mouth daily.   aspirin  81 MG chewable tablet Chew 81 mg by mouth daily.   atorvastatin 80 MG tablet Commonly known as: LIPITOR Take 1  tablet by mouth daily.   citalopram 20 MG tablet Commonly known as: CELEXA Take 20 mg by mouth daily.   clomiPHENE  50 MG tablet Commonly known as: CLOMID  Take 1/2 tablet daily   clopidogrel 75 MG tablet Commonly known as: PLAVIX Take 75 mg by mouth daily.   FLUoxetine 20 MG capsule Commonly known as: PROZAC TAKE ONE CAPSULE BY MOUTH ONCE DAILY FOR MOOD   furosemide 20 MG tablet Commonly known as: LASIX Take 2 pills a day   losartan 25 MG tablet Commonly known as: COZAAR Take 25 mg by mouth daily.   metoprolol succinate 25 MG 24 hr tablet Commonly known as: TOPROL-XL Take 25 mg by mouth daily.   pantoprazole 40 MG tablet Commonly known as: PROTONIX Take 40 mg by mouth daily.   potassium chloride 10 MEQ tablet Commonly known as: KLOR-CON Take 10 mEq by mouth daily.   rosuvastatin 10 MG tablet Commonly known as: CRESTOR Take 1 tablet by mouth daily.   sildenafil 100 MG tablet Commonly known as: VIAGRA Take 100 mg by mouth as needed for erectile dysfunction.   tadalafil  20 MG tablet Commonly known as: CIALIS  Take 20 mg by mouth daily.   Vitamin D-3 125 MCG (5000 UT) Tabs Take by mouth.        Allergies: No Known Allergies  Family  History: Family History  Problem Relation Age of Onset   Hypertension Mother    Heart failure Mother    Hypertension Father    Heart failure Father    Cancer Father     Social History:  reports that he has quit smoking. His smoking use included cigarettes. He has been exposed to tobacco smoke. He has never used smokeless tobacco. He reports that he does not drink alcohol and does not use drugs.  ROS: Pertinent ROS in HPI  Physical Exam: There were no vitals taken for this visit.  Constitutional:  Well nourished. Alert and oriented, No acute distress. HEENT: [place] AT, moist mucus membranes.  Trachea midline, no masses. Cardiovascular: No clubbing, cyanosis, or edema. Respiratory: Normal respiratory effort, no increased  work of breathing. GI: Abdomen is soft, non tender, non distended, no abdominal masses. Liver and spleen not palpable.  No hernias appreciated.  Stool sample for occult testing is not indicated.   GU: No CVA tenderness.  No bladder fullness or masses.  Patient with circumcised/uncircumcised phallus. ***Foreskin easily retracted***  Urethral meatus is patent.  No penile discharge. No penile lesions or rashes. Scrotum without lesions, cysts, rashes and/or edema.  Testicles are located scrotally bilaterally. No masses are appreciated in the testicles. Left and right epididymis are normal. Rectal: Patient with  normal sphincter tone. Anus and perineum without scarring or rashes. No rectal masses are appreciated. Prostate is approximately *** grams, *** nodules are appreciated. Seminal vesicles are normal. Skin: No rashes, bruises or suspicious lesions. Lymph: No cervical or inguinal adenopathy. Neurologic: Grossly intact, no focal deficits, moving all 4 extremities. Psychiatric: Normal mood and affect.   Laboratory Data: Pending    Pertinent Imaging: N/A  Assessment & Plan:    1. Testosterone  deficiency -testosterone  level is pending  2. Erectile Dysfunction -He will start taking tadalafil  20 mg daily in the a.m.   No follow-ups on file.  These notes generated with voice recognition software. I apologize for typographical errors.  Jaime Cook  [facility] [address]  [address] [place], [place] [address] [phone]

## 2023-02-12 LAB — ESTRADIOL: Estradiol: 62.9 pg/mL — ABNORMAL HIGH (ref 7.6–42.6)

## 2023-02-14 ENCOUNTER — Ambulatory Visit: Payer: Self-pay | Admitting: Urology

## 2023-02-14 DIAGNOSIS — E349 Endocrine disorder, unspecified: Secondary | ICD-10-CM

## 2023-02-14 DIAGNOSIS — N529 Male erectile dysfunction, unspecified: Secondary | ICD-10-CM

## 2023-02-14 LAB — TESTOSTERONE,FREE AND TOTAL
Testosterone, Free: 11.7 pg/mL (ref 6.6–18.1)
Testosterone: 685 ng/dL (ref 264–916)

## 2023-02-19 NOTE — Progress Notes (Deleted)
02/21/2023 8:21 PM   Jaime Cook Dec 29, 1960 [id number]  Referring provider: Inc, [facility] [address] [place],  [place] [address]  Urological history: 1.  Erectile dysfunction -Contributing factors of age, obesity, depression, hypertension, history of smoking, CAD and HLD -Testosterone level (02/2023) - 685 -Free testosterone (02/2023) - 11.7 -Hemoglobin/hematocrit (12/2022) 12.8/38.2 -estradiol (02/2023) 62.9 -Failed sildenafil 100 mg on demand dosing -Failed Cialis 5 mg daily  2. Premature ejaculation  3. BPH -PSA (12/2022) 0.46  No chief complaint on file.  HPI: Jaime Cook is a 62 y.o. male who presents today for one month follow up.    Previous records reviewed.   At his visit on 12/13/2022, Repeat testosterone was 317.  SHIM 9.  He is taking the tadalafil 20 mg at night and is waking up with a very firm erection.  He has not tried intercourse with his wife yet because she is not a morning person.  Patient still having spontaneous erections.  He denies any pain or curvature with erections.   I PSS 9/2.  No urinary complaints.  Patient denies any modifying or aggravating factors.  Patient denies any recent UTI's, gross hematuria, dysuria or suprapubic/flank pain.  Patient denies any fevers, chills, nausea or vomiting.   We started Clomid 50 mg, 1/2 daily and taking tadalafil 20 mg daily.    SHIM ***    Score: 1-7 Severe ED 8-11 Moderate ED 12-16 Mild-Moderate ED 17-21 Mild ED 22-25 No ED   PMH: Past Medical History:  Diagnosis Date   Heart murmur    Hypertension     Surgical History: Past Surgical History:  Procedure Laterality Date   TONSILLECTOMY      Home Medications:  Allergies as of 02/21/2023   No Known Allergies      Medication List        Accurate as of February 19, 2023  8:21 PM. If you have any questions, ask your nurse or doctor.          amLODipine 10 MG tablet Commonly known as: NORVASC Take 1 tablet by  mouth daily.   aspirin 81 MG chewable tablet Chew 81 mg by mouth daily.   atorvastatin 80 MG tablet Commonly known as: LIPITOR Take 1 tablet by mouth daily.   citalopram 20 MG tablet Commonly known as: CELEXA Take 20 mg by mouth daily.   clomiPHENE 50 MG tablet Commonly known as: CLOMID Take 1/2 tablet daily   clopidogrel 75 MG tablet Commonly known as: PLAVIX Take 75 mg by mouth daily.   FLUoxetine 20 MG capsule Commonly known as: PROZAC TAKE ONE CAPSULE BY MOUTH ONCE DAILY FOR MOOD   furosemide 20 MG tablet Commonly known as: LASIX Take 2 pills a day   losartan 25 MG tablet Commonly known as: COZAAR Take 25 mg by mouth daily.   metoprolol succinate 25 MG 24 hr tablet Commonly known as: TOPROL-XL Take 25 mg by mouth daily.   pantoprazole 40 MG tablet Commonly known as: PROTONIX Take 40 mg by mouth daily.   potassium chloride 10 MEQ tablet Commonly known as: KLOR-CON Take 10 mEq by mouth daily.   rosuvastatin 10 MG tablet Commonly known as: CRESTOR Take 1 tablet by mouth daily.   sildenafil 100 MG tablet Commonly known as: VIAGRA Take 100 mg by mouth as needed for erectile dysfunction.   tadalafil 20 MG tablet Commonly known as: CIALIS Take 20 mg by mouth daily.   Vitamin D-3 125 MCG (5000  UT) Tabs Take by mouth.        Allergies: No Known Allergies  Family History: Family History  Problem Relation Age of Onset   Hypertension Mother    Heart failure Mother    Hypertension Father    Heart failure Father    Cancer Father     Social History:  reports that he has quit smoking. His smoking use included cigarettes. He has been exposed to tobacco smoke. He has never used smokeless tobacco. He reports that he does not drink alcohol and does not use drugs.  ROS: Pertinent ROS in HPI  Physical Exam: There were no vitals taken for this visit.  Constitutional:  Well nourished. Alert and oriented, No acute distress. HEENT: [place] AT, moist mucus  membranes.  Trachea midline, no masses. Cardiovascular: No clubbing, cyanosis, or edema. Respiratory: Normal respiratory effort, no increased work of breathing. GI: Abdomen is soft, non tender, non distended, no abdominal masses. Liver and spleen not palpable.  No hernias appreciated.  Stool sample for occult testing is not indicated.   GU: No CVA tenderness.  No bladder fullness or masses.  Patient with circumcised/uncircumcised phallus. ***Foreskin easily retracted***  Urethral meatus is patent.  No penile discharge. No penile lesions or rashes. Scrotum without lesions, cysts, rashes and/or edema.  Testicles are located scrotally bilaterally. No masses are appreciated in the testicles. Left and right epididymis are normal. Rectal: Patient with  normal sphincter tone. Anus and perineum without scarring or rashes. No rectal masses are appreciated. Prostate is approximately *** grams, *** nodules are appreciated. Seminal vesicles are normal. Skin: No rashes, bruises or suspicious lesions. Lymph: No cervical or inguinal adenopathy. Neurologic: Grossly intact, no focal deficits, moving all 4 extremities. Psychiatric: Normal mood and affect.   Laboratory Data: Pending    Pertinent Imaging: N/A  Assessment & Plan:    1. Testosterone deficiency -testosterone level is at therapeutic level -Estradiol is elevated as well, I explained to him that testosterone can also be converted to estrogen typically in our adipose tissue and it can be addressed with another medication or we can discontinue the Clomid therapy altogether and see if the estradiol corrects itself  2. Erectile Dysfunction -He will start taking tadalafil 20 mg daily in the a.m.   No follow-ups on file.  These notes generated with voice recognition software. I apologize for typographical errors.  Cloretta Ned  [facility] [address]  [address] [place], [place] [address] [phone]

## 2023-02-21 ENCOUNTER — Ambulatory Visit: Payer: Self-pay | Admitting: Urology

## 2023-02-21 DIAGNOSIS — N529 Male erectile dysfunction, unspecified: Secondary | ICD-10-CM

## 2023-02-21 DIAGNOSIS — E349 Endocrine disorder, unspecified: Secondary | ICD-10-CM

## 2023-03-23 ENCOUNTER — Ambulatory Visit
Admission: EM | Admit: 2023-03-23 | Discharge: 2023-03-23 | Disposition: A | Discharge status: Home / Self Care | Payer: Self-pay | Attending: Family Medicine | Admitting: Family Medicine

## 2023-03-23 ENCOUNTER — Ambulatory Visit (INDEPENDENT_AMBULATORY_CARE_PROVIDER_SITE_OTHER): Payer: Self-pay

## 2023-03-23 DIAGNOSIS — S2220XD Unspecified fracture of sternum, subsequent encounter for fracture with routine healing: Secondary | ICD-10-CM

## 2023-03-23 DIAGNOSIS — R0789 Other chest pain: Secondary | ICD-10-CM

## 2023-03-23 MED ORDER — METHOCARBAMOL 500 MG PO TABS: 500.0000 mg | ORAL_TABLET | Freq: Three times a day (TID) | ORAL | 0 refills | Status: AC | PRN

## 2023-03-23 MED ORDER — GABAPENTIN 100 MG PO CAPS: 100.0000 mg | ORAL_CAPSULE | Freq: Three times a day (TID) | ORAL | 0 refills | Status: AC

## 2023-03-23 MED ORDER — PREDNISONE 10 MG (21) PO TBPK: ORAL_TABLET | Freq: Every day | ORAL | 0 refills | Status: DC

## 2023-03-23 MED ORDER — PREDNISONE 10 MG (21) PO TBPK: ORAL_TABLET | Freq: Every day | ORAL | 0 refills | Status: AC

## 2023-03-23 MED ORDER — GABAPENTIN 100 MG PO CAPS: 100.0000 mg | ORAL_CAPSULE | Freq: Three times a day (TID) | ORAL | 0 refills | Status: DC

## 2023-03-23 MED ORDER — METHOCARBAMOL 500 MG PO TABS: 500.0000 mg | ORAL_TABLET | Freq: Three times a day (TID) | ORAL | 0 refills | Status: DC | PRN

## 2023-03-23 NOTE — Discharge Instructions (Addendum)
 Follow up with your primary care provider to discuss physical therapy.  Take the muscle relaxer and gabapentin at your bedtime or a few hours before. Avoid driving or operating heavy machinery while taking this medication as it can make you sleepy.
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
## 2023-08-08 ENCOUNTER — Other Ambulatory Visit: Payer: Self-pay | Admitting: Urology

## 2023-08-08 DIAGNOSIS — R7989 Other specified abnormal findings of blood chemistry: Secondary | ICD-10-CM

## 2023-08-23 IMAGING — CR DG TIBIA/FIBULA 2V*R*
4 series · 4 of 4 positions shown · non-contrast
Comparison: None.

CLINICAL DATA: Right lower leg edema and mass.

EXAM:
RIGHT TIBIA AND FIBULA - 2 VIEW

[tibia ap (1 of 2)]
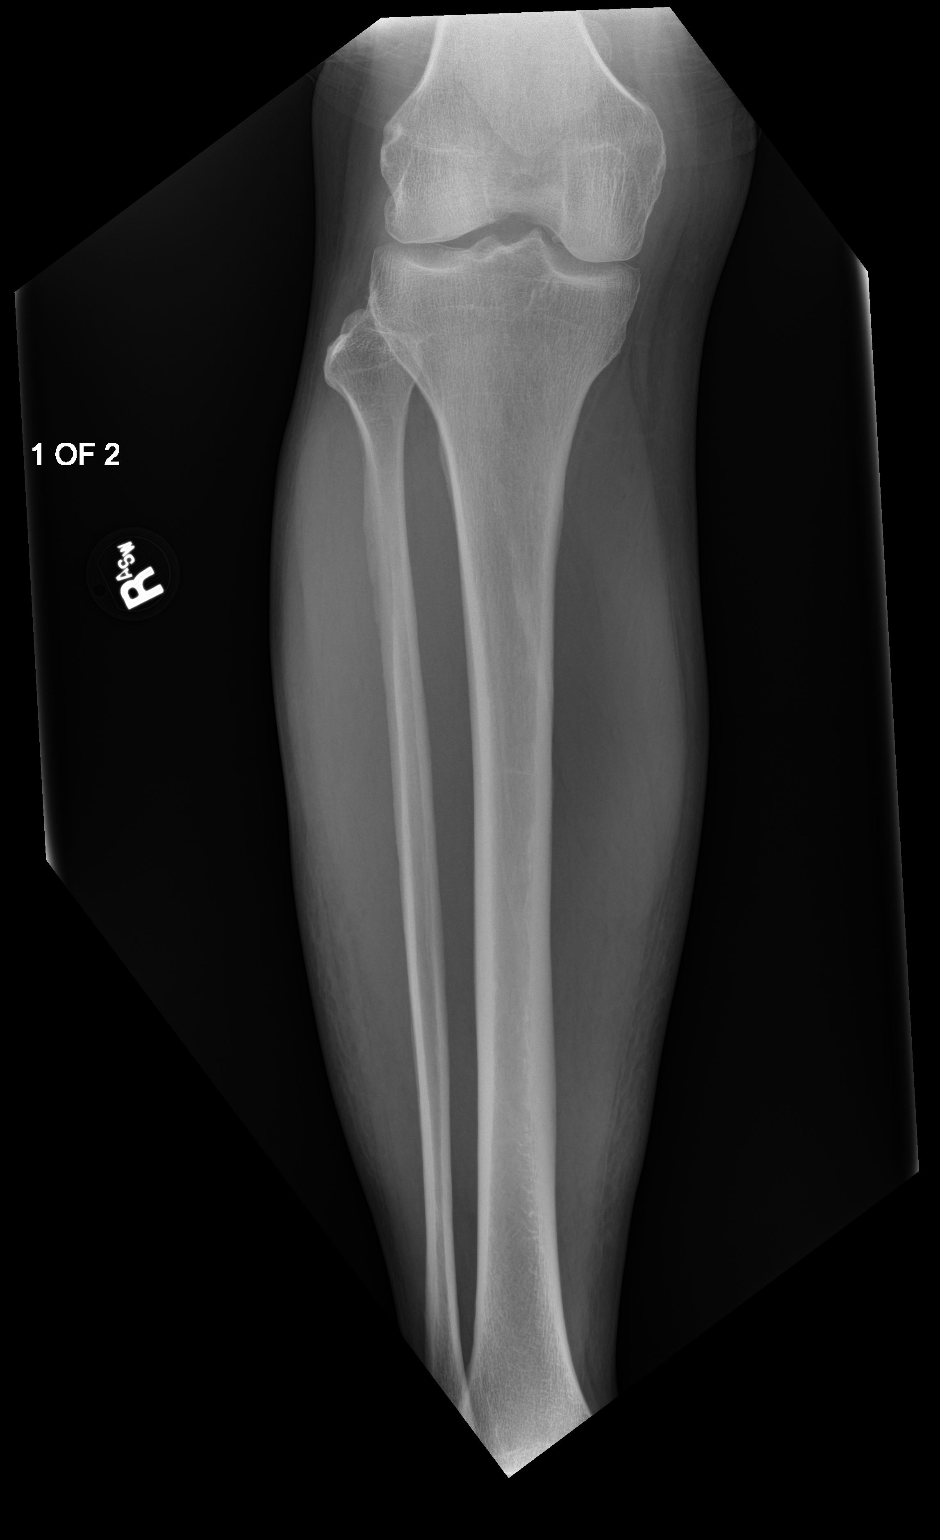

[tibia ap (2 of 2)]
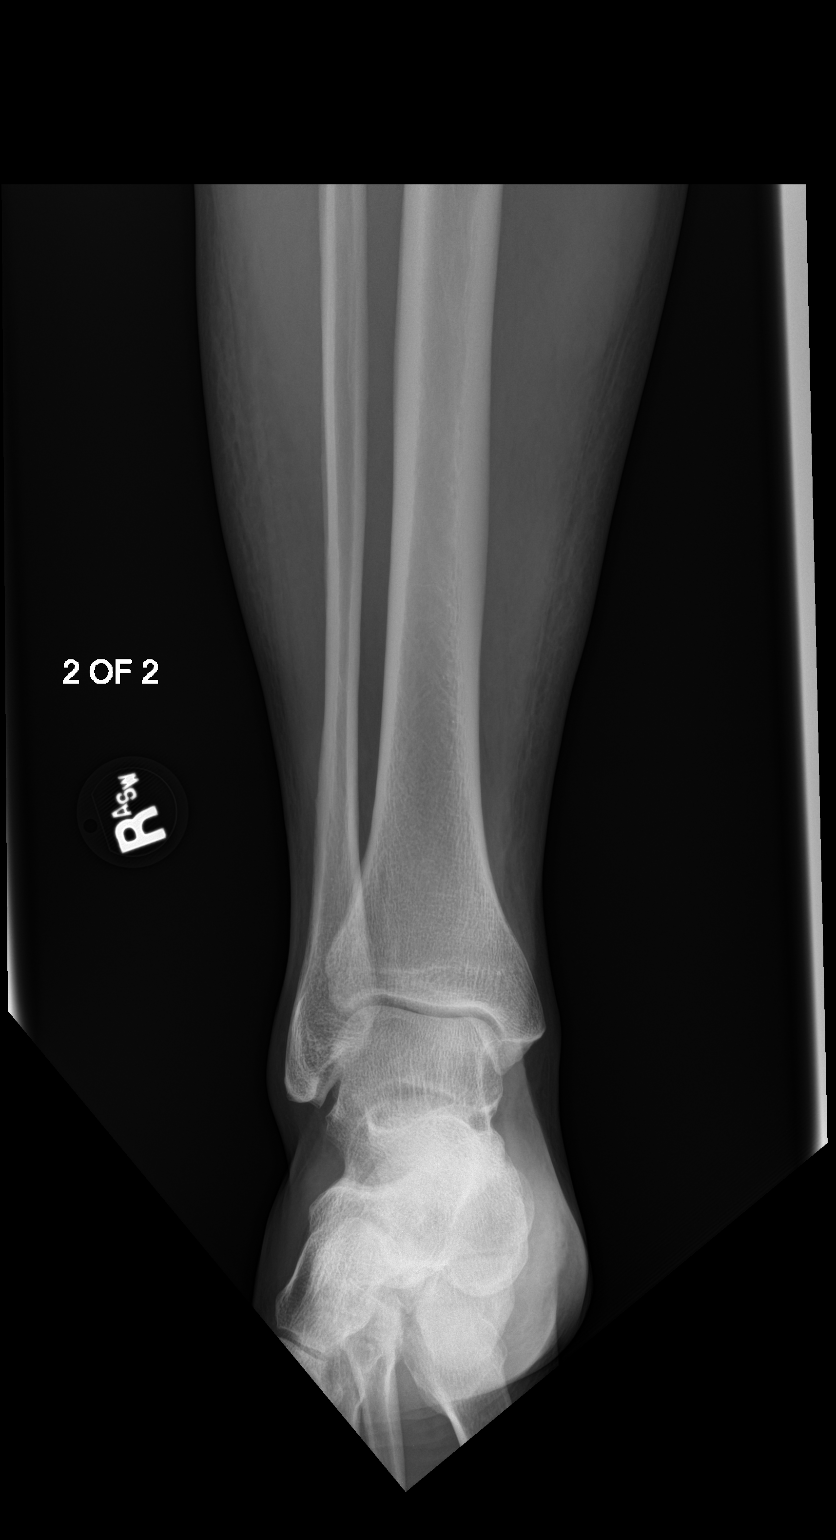

[tibia lat (1 of 2)]
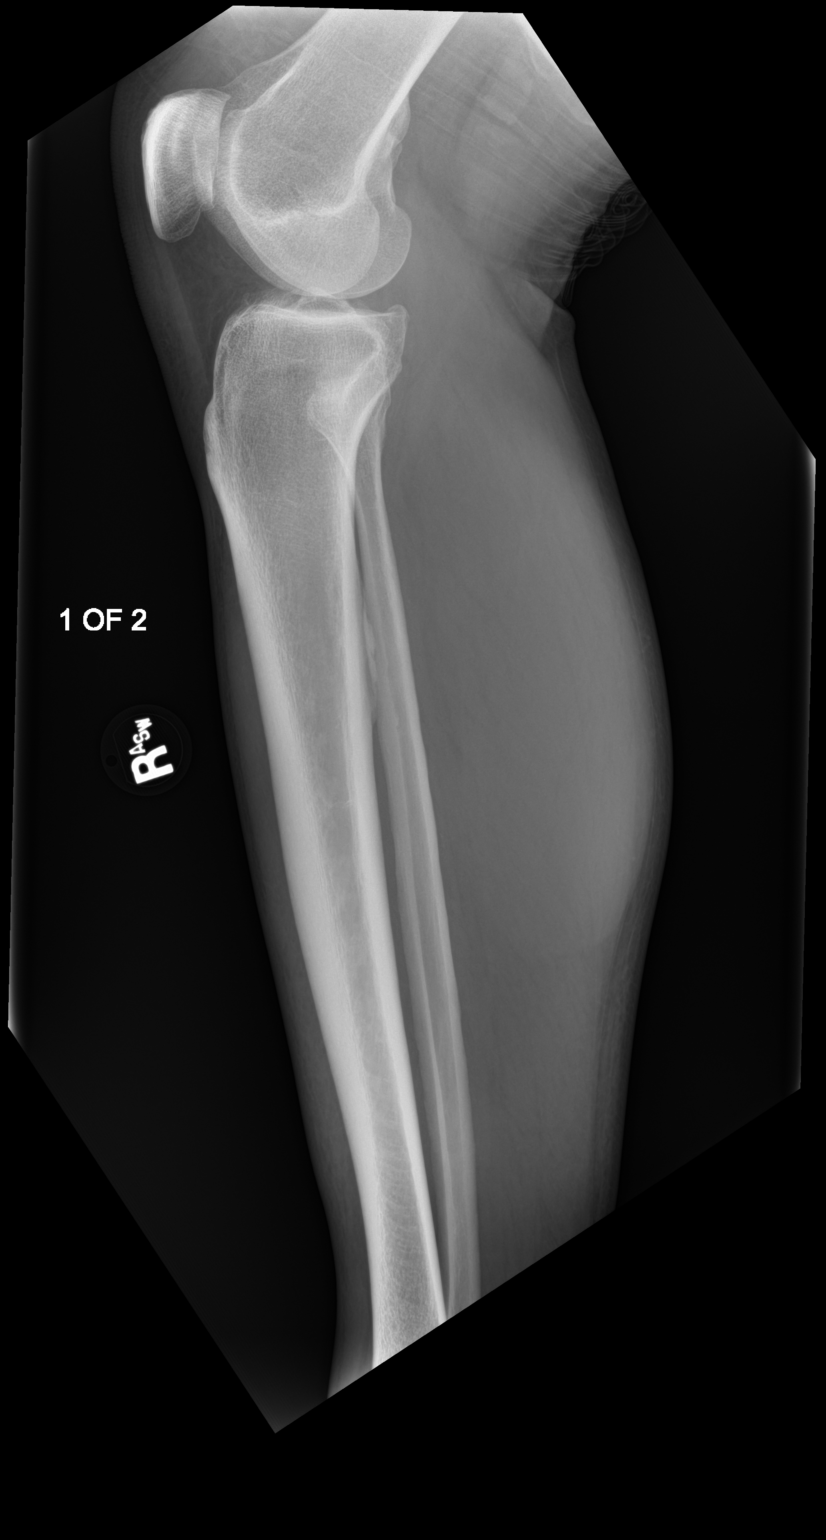

[tibia lat (2 of 2)]
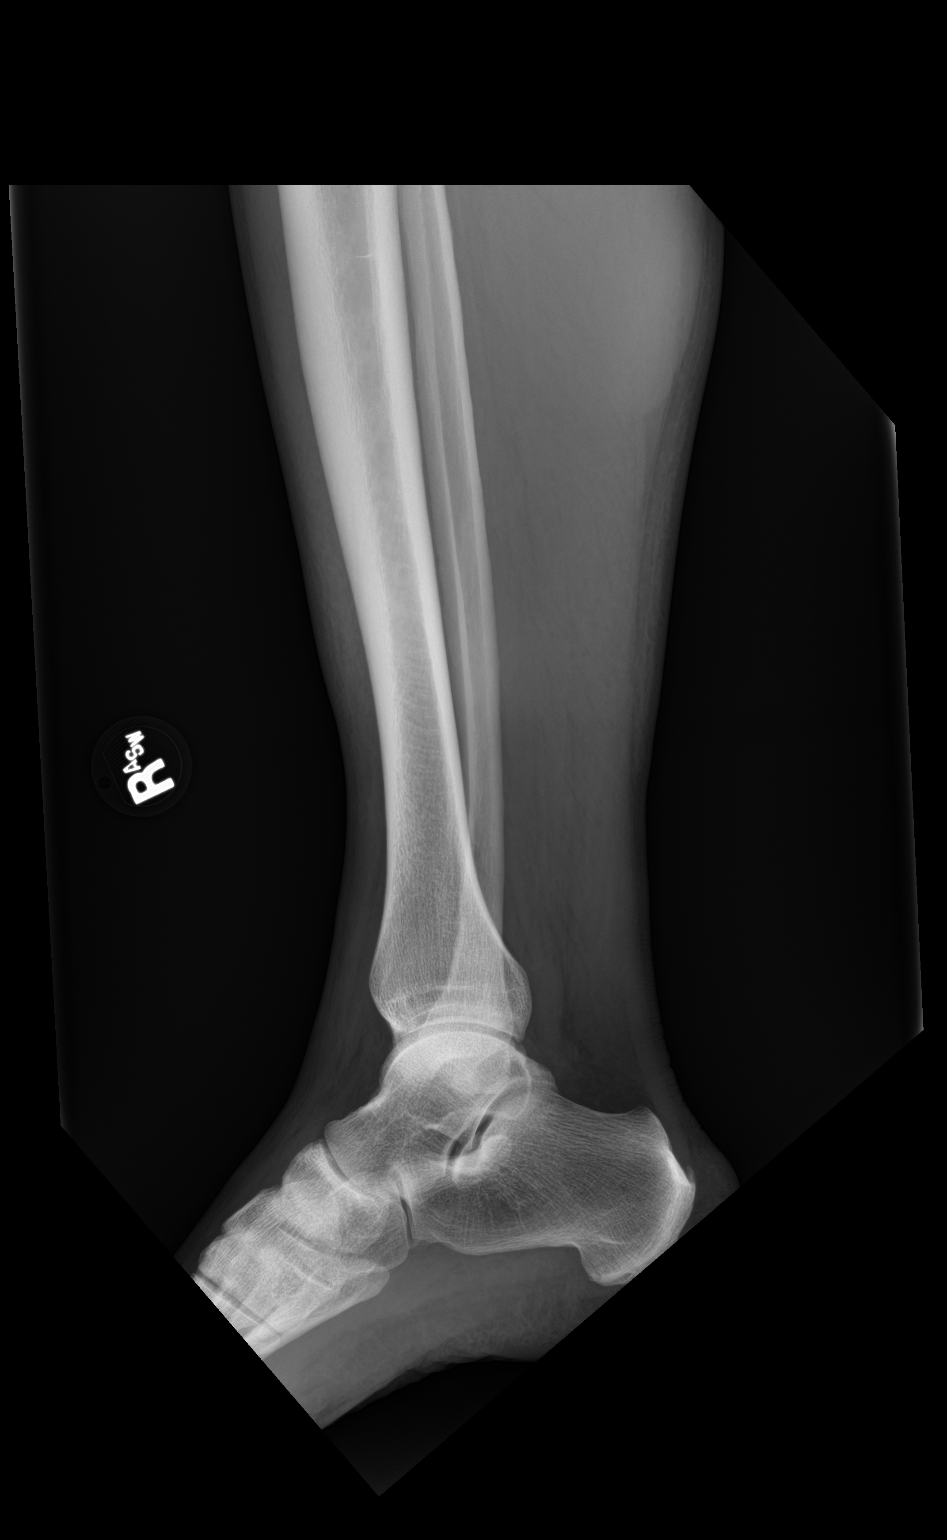

[4 of 4 positions shown; findings below may reference images not displayed]

FINDINGS: No fracture, dislocation or bone lesion identified. No evidence of
bone destruction. Soft tissues are grossly unremarkable.
IMPRESSION: Negative.

## 2023-09-12 ENCOUNTER — Encounter: Payer: Self-pay | Admitting: Emergency Medicine

## 2023-09-12 ENCOUNTER — Ambulatory Visit
Admission: EM | Admit: 2023-09-12 | Discharge: 2023-09-12 | Disposition: A | Discharge status: Home / Self Care | Payer: Self-pay

## 2023-09-12 DIAGNOSIS — B349 Viral infection, unspecified: Secondary | ICD-10-CM

## 2023-09-12 DIAGNOSIS — R509 Fever, unspecified: Secondary | ICD-10-CM | POA: Insufficient documentation

## 2023-09-12 DIAGNOSIS — R519 Headache, unspecified: Secondary | ICD-10-CM | POA: Insufficient documentation

## 2023-09-12 DIAGNOSIS — M791 Myalgia, unspecified site: Secondary | ICD-10-CM | POA: Insufficient documentation

## 2023-09-12 DIAGNOSIS — R112 Nausea with vomiting, unspecified: Secondary | ICD-10-CM

## 2023-09-12 DIAGNOSIS — R197 Diarrhea, unspecified: Secondary | ICD-10-CM | POA: Insufficient documentation

## 2023-09-12 LAB — SARS CORONAVIRUS 2 BY RT PCR: SARS Coronavirus 2 by RT PCR: NEGATIVE

## 2023-09-12 MED ORDER — ONDANSETRON HCL 4 MG PO TABS: 4.0000 mg | ORAL_TABLET | Freq: Three times a day (TID) | ORAL | 0 refills | Status: AC | PRN

## 2023-09-12 NOTE — Discharge Instructions (Signed)
 Your covid test was negative Most likely you have a viral illness: no antibiotic is indicated at this time, May treat with OTC meds of choice. Make sure to drink plenty of fluids to stay hydrated(gatorade, water, popsicles,jello,etc), avoid caffeine  products. Follow up with PCP.  If you are unable to keep fluids down, chest pain, shortness of breath, or worsening symptoms go to the emergency room for further evaluation

## 2023-09-12 NOTE — ED Triage Notes (Signed)
 Pt states for the past 2 days he has vomiting, diarrhea, headache, bodyaches and fever. He has taken tylenol  for his symptoms. His highest temp was 100.1

## 2023-09-12 NOTE — ED Provider Notes (Signed)
 [facility]    CSN: [id number] Arrival date & time: 09/12/23  1046      History   Chief Complaint Chief Complaint  Patient presents with   Diarrhea   Emesis   Headache   Fever   Generalized Body Aches    HPI Jaime Cook is a 63 y.o. male.   63 year old male patient, Jaime Cook, presents to urgent care for evaluation of vomiting, diarrhea, headache, body aches and fever for 2 days.  Patient is taking Tylenol  and his wife's phenergan  for symptom management, highest temperature was 100.1.  Patient is afebrile in office with temp 98.3, heart rate 72, blood pressure 123/70, no acute distress  The history is provided by the patient. No language interpreter was used.    Past Medical History:  Diagnosis Date   Heart murmur    Hypertension     Patient Active Problem List   Diagnosis Date Noted   Nausea vomiting and diarrhea 09/12/2023   Nonspecific syndrome suggestive of viral illness 09/12/2023   Viral gastroenteritis 08/24/2022   Chest pain 11/02/2018    Past Surgical History:  Procedure Laterality Date   TONSILLECTOMY         Home Medications    Prior to Admission medications   Medication Sig Start Date End Date Taking? Authorizing Provider  ibuprofen  (ADVIL ) 800 MG tablet  07/25/23  Yes [provider]  omeprazole (PRILOSEC) 20 MG capsule  09/12/20  Yes [provider]  ondansetron  (ZOFRAN ) 4 MG tablet Take 1 tablet (4 mg total) by mouth every 8 (eight) hours as needed for up to 3 days for nausea or vomiting. 09/12/23 09/15/23 Yes Jayleen Scaglione, Rilla, NP  potassium chloride (KLOR-CON M) 10 MEQ tablet Take 10 mEq by mouth daily. 07/14/23  Yes [provider]  amLODipine (NORVASC) 10 MG tablet Take 1 tablet by mouth daily.    [provider]  aspirin  81 MG chewable tablet Chew 81 mg by mouth daily.    [provider]  atorvastatin (LIPITOR) 80 MG tablet Take 1 tablet by mouth daily. 02/14/21   [provider]  Cholecalciferol (VITAMIN D-3) 125 MCG (5000 UT) TABS Take by mouth.    [provider]  citalopram (CELEXA) 20 MG tablet Take 20 mg by mouth daily.    [provider]  CLOMID  50 MG tablet TAKE 1/2 TABLET BY MOUTH ONCE DAILY 08/08/23   McGowan, Clotilda A, PA-C  ELIQUIS 5 MG TABS tablet Take 5 mg by mouth 2 (two) times daily.    [provider]  FLUoxetine (PROZAC) 20 MG capsule TAKE ONE CAPSULE BY MOUTH ONCE DAILY FOR MOOD 09/26/20   [provider]  furosemide (LASIX) 20 MG tablet Take 2 pills a day 10/08/21   [provider]  gabapentin  (NEURONTIN ) 100 MG capsule Take 1 capsule (100 mg total) by mouth 3 (three) times daily. 03/23/23   Brimage, Vondra, DO  losartan (COZAAR) 25 MG tablet Take 25 mg by mouth daily. 06/12/21   [provider]  methocarbamol  (ROBAXIN ) 500 MG tablet Take 1 tablet (500 mg total) by mouth every 8 (eight) hours as needed for muscle spasms. 03/23/23   Brimage, Vondra, DO  metoprolol succinate (TOPROL-XL) 25 MG 24 hr tablet Take 25 mg by mouth daily.    [provider]  pantoprazole (PROTONIX) 40 MG tablet Take 40 mg by mouth daily. 07/13/21   [provider]  potassium chloride (KLOR-CON) 10 MEQ tablet Take 10 mEq by mouth  daily. 06/25/20   [provider]  predniSONE  (STERAPRED UNI-PAK 21 TAB) 10 MG (21) TBPK tablet Take by mouth daily. Take 6 tabs by mouth daily for 1, then 5 tabs for 1 day, then 4 tabs for 1 day, then 3 tabs for 1 day, then 2 tabs for 1 day, then 1 tab for 1 day. 03/23/23   Brimage, Vondra, DO  rosuvastatin (CRESTOR) 10 MG tablet Take 1 tablet by mouth daily. 09/30/21 09/30/22  [provider]  sildenafil (VIAGRA) 100 MG tablet Take 100 mg by mouth as needed for erectile dysfunction. 11/26/22   [provider]  tadalafil  (CIALIS ) 20 MG tablet Take 20 mg by mouth daily.    [provider]    Family History Family History  Problem Relation Age  of Onset   Hypertension Mother    Heart failure Mother    Hypertension Father    Heart failure Father    Cancer Father     Social History Social History   Tobacco Use   Smoking status: Former    Types: Cigarettes    Passive exposure: Past   Smokeless tobacco: Never  Vaping Use   Vaping status: Never Used  Substance Use Topics   Alcohol use: Never   Drug use: Never     Allergies   Patient has no known allergies.   Review of Systems Review of Systems  Constitutional:  Positive for fever.  Respiratory:  Negative for cough.   Cardiovascular:  Negative for chest pain and palpitations.  Gastrointestinal:  Positive for diarrhea and vomiting.  Musculoskeletal:  Positive for myalgias.  Neurological:  Positive for headaches.  All other systems reviewed and are negative.    Physical Exam Triage Vital Signs ED Triage Vitals  Encounter Vitals Group     BP 09/12/23 1124 123/70     Girls Systolic BP Percentile --      Girls Diastolic BP Percentile --      Boys Systolic BP Percentile --      Boys Diastolic BP Percentile --      Pulse Rate 09/12/23 1124 72     Resp 09/12/23 1124 18     Temp 09/12/23 1124 98.3 F (36.8 C)     Temp src --      SpO2 09/12/23 1124 96 %     Weight --      Height --      Head Circumference --      Peak Flow --      Pain Score 09/12/23 1121 6     Pain Loc --      Pain Education --      Exclude from Growth Chart --    No data found.  Updated Vital Signs BP 123/70 (BP Location: Left Arm)   Pulse 72   Temp 98.3 F (36.8 C)   Resp 18   SpO2 96%   Visual Acuity Right Eye Distance:   Left Eye Distance:   Bilateral Distance:    Right Eye Near:   Left Eye Near:    Bilateral Near:     Physical Exam Vitals and nursing note reviewed.  Constitutional:      General: He is not in acute distress.    Appearance: He is well-developed. He is not ill-appearing or toxic-appearing.  HENT:     Head: Normocephalic.     Right Ear: Tympanic  membrane is retracted.     Left Ear: Tympanic membrane is retracted.  Nose: Mucosal edema and congestion present.     Mouth/Throat:     Lips: Pink.     Mouth: Mucous membranes are moist.     Pharynx: Oropharynx is clear. Uvula midline.  Eyes:     General: Lids are normal.     Conjunctiva/sclera: Conjunctivae normal.     Pupils: Pupils are equal, round, and reactive to light.  Cardiovascular:     Rate and Rhythm: Normal rate and regular rhythm.     Heart sounds: Normal heart sounds.  Pulmonary:     Effort: Pulmonary effort is normal. No respiratory distress.     Breath sounds: Normal breath sounds and air entry. No decreased breath sounds or wheezing.  Abdominal:     General: Bowel sounds are normal. There is no distension.     Palpations: Abdomen is soft.  Musculoskeletal:        General: Normal range of motion.     Cervical back: Normal range of motion.  Skin:    General: Skin is warm and dry.     Findings: No rash.  Neurological:     General: No focal deficit present.     Mental Status: He is alert and oriented to person, place, and time.     GCS: GCS eye subscore is 4. GCS verbal subscore is 5. GCS motor subscore is 6.     Cranial Nerves: No cranial nerve deficit.     Sensory: No sensory deficit.  Psychiatric:        Attention and Perception: Attention normal.        Mood and Affect: Mood normal.        Speech: Speech normal.        Behavior: Behavior normal. Behavior is cooperative.      UC Treatments / Results  Labs (all labs ordered are listed, but only abnormal results are displayed) Labs Reviewed  SARS CORONAVIRUS 2 BY RT PCR    EKG   Radiology No results found.  Procedures Procedures (including critical care time)  Medications Ordered in UC Medications - No data to display  Initial Impression / Assessment and Plan / UC Course  I have reviewed the triage vital signs and the nursing notes.  Pertinent labs & imaging results that were available  during my care of the patient were reviewed by me and considered in my medical decision making (see chart for details).    Discussed exam findings and plan of care with patient, zofran  scripted, strict go to ER precautions given.   Patient verbalized understanding to this provider.  Ddx: Nausea,vomiting, diarrhea, gastroenteritis, viral illness Final Clinical Impressions(s) / UC Diagnoses   Final diagnoses:  Nausea vomiting and diarrhea  Nonspecific syndrome suggestive of viral illness     Discharge Instructions      Your covid test was negative Most likely you have a viral illness: no antibiotic is indicated at this time, May treat with OTC meds of choice. Make sure to drink plenty of fluids to stay hydrated(gatorade, water, popsicles,jello,etc), avoid caffeine  products. Follow up with PCP.  If you are unable to keep fluids down, chest pain, shortness of breath, or worsening symptoms go to the emergency room for further evaluation     ED Prescriptions     Medication Sig Dispense Auth. Provider   ondansetron  (ZOFRAN ) 4 MG tablet Take 1 tablet (4 mg total) by mouth every 8 (eight) hours as needed for up to 3 days for nausea or vomiting. 9 tablet Trice Aspinall, NP  PDMP not reviewed this encounter.   Aminta Loose, NP 09/12/23 1621

## 2023-11-28 ENCOUNTER — Other Ambulatory Visit: Payer: Self-pay | Admitting: Urology

## 2023-11-28 DIAGNOSIS — R7989 Other specified abnormal findings of blood chemistry: Secondary | ICD-10-CM

## 2023-12-08 ENCOUNTER — Other Ambulatory Visit: Payer: Self-pay | Admitting: Urology

## 2023-12-23 ENCOUNTER — Encounter: Payer: Self-pay | Admitting: Emergency Medicine

## 2023-12-23 ENCOUNTER — Ambulatory Visit
Admission: EM | Admit: 2023-12-23 | Discharge: 2023-12-23 | Disposition: A | Discharge status: Home / Self Care | Payer: Self-pay | Attending: Emergency Medicine | Admitting: Emergency Medicine

## 2023-12-23 ENCOUNTER — Ambulatory Visit: Payer: Self-pay

## 2023-12-23 DIAGNOSIS — L03115 Cellulitis of right lower limb: Secondary | ICD-10-CM

## 2023-12-23 DIAGNOSIS — S8991XA Unspecified injury of right lower leg, initial encounter: Secondary | ICD-10-CM

## 2023-12-23 MED ORDER — CEPHALEXIN 500 MG PO CAPS: 500.0000 mg | ORAL_CAPSULE | Freq: Four times a day (QID) | ORAL | 0 refills | Status: AC

## 2023-12-23 NOTE — Discharge Instructions (Signed)
 Your xray was negative for fracture. Take antibiotic as label directed for cellulitis. Rest,elevate right leg.  If you have new or worsening symptoms, unable to take that medication, go to the emergency room for further evaluation

## 2023-12-23 NOTE — ED Triage Notes (Signed)
 Pt c/o right lower leg pain. He states he was carrying his groceries in the house and hit his lower leg on the step and fell forward. He states this happened about 3 nights ago. He also has some redness and bruise in his right foot. He states he did not hit his foot and the foot is not painful.

## 2023-12-23 NOTE — ED Provider Notes (Signed)
 [facility]    CSN: [id number] Arrival date & time: 12/23/23  1527      History   Chief Complaint Chief Complaint  Patient presents with   Leg Pain    Right     HPI Jaime Cook is a 63 y.o. male.   63 year old male pt, Jaime Cook, presents to urgent care for evaluation of right lower leg pain, swelling and redness after hitting his right shin on concrete step at home 3 nights ago.  Patient states he has redness, bruising to right leg and foot.  Patient states he did not hit his foot and the foot is not painful but has bruising noted to right lateral foot.   The history is provided by the patient. No language interpreter was used.    Past Medical History:  Diagnosis Date   Heart murmur    Hypertension     Patient Active Problem List   Diagnosis Date Noted   Injury of right leg 12/23/2023   Cellulitis of right lower extremity 12/23/2023   Nausea vomiting and diarrhea 09/12/2023   Nonspecific syndrome suggestive of viral illness 09/12/2023   Viral gastroenteritis 08/24/2022   Chest pain 11/02/2018    Past Surgical History:  Procedure Laterality Date   TONSILLECTOMY         Home Medications    Prior to Admission medications   Medication Sig Start Date End Date Taking? Authorizing Provider  amLODipine (NORVASC) 10 MG tablet Take 1 tablet by mouth daily.   Yes [provider]  atorvastatin (LIPITOR) 80 MG tablet Take 1 tablet by mouth daily. 02/14/21  Yes [provider]  cephALEXin  (KEFLEX ) 500 MG capsule Take 1 capsule (500 mg total) by mouth 4 (four) times daily for 7 days. 12/23/23 12/30/23 Yes Jameika Kinn, NP  Cholecalciferol (VITAMIN D-3) 125 MCG (5000 UT) TABS Take by mouth.   Yes [provider]  citalopram (CELEXA) 20 MG tablet Take 20 mg by mouth daily.   Yes [provider]  CLOMID  50 MG tablet TAKE 1/2 TABLET BY MOUTH ONCE DAILY 11/28/23  Yes McGowan, Shannon A, PA-C  ELIQUIS 5 MG TABS tablet Take  5 mg by mouth 2 (two) times daily.   Yes [provider]  FLUoxetine (PROZAC) 20 MG capsule TAKE ONE CAPSULE BY MOUTH ONCE DAILY FOR MOOD 09/26/20  Yes [provider]  furosemide (LASIX) 20 MG tablet Take 2 pills a day 10/08/21  Yes [provider]  gabapentin  (NEURONTIN ) 100 MG capsule Take 1 capsule (100 mg total) by mouth 3 (three) times daily. 03/23/23  Yes Brimage, Vondra, DO  losartan (COZAAR) 25 MG tablet Take 25 mg by mouth daily. 06/12/21  Yes [provider]  methocarbamol  (ROBAXIN ) 500 MG tablet Take 1 tablet (500 mg total) by mouth every 8 (eight) hours as needed for muscle spasms. 03/23/23  Yes Brimage, Vondra, DO  metoprolol succinate (TOPROL-XL) 25 MG 24 hr tablet Take 25 mg by mouth daily.   Yes [provider]  potassium chloride (KLOR-CON M) 10 MEQ tablet Take 10 mEq by mouth daily. 07/14/23  Yes [provider]  rosuvastatin (CRESTOR) 10 MG tablet Take 1 tablet by mouth daily. 09/30/21 12/23/23 Yes [provider]  tadalafil  (CIALIS ) 20 MG tablet Take 20 mg by mouth daily.   Yes [provider]  aspirin  81 MG chewable tablet Chew 81 mg by mouth daily.    [provider]  ibuprofen  (ADVIL ) 800 MG tablet  07/25/23   [provider]  omeprazole (PRILOSEC) 20 MG capsule  09/12/20   [provider]  pantoprazole (PROTONIX) 40 MG tablet Take 40 mg by mouth daily. 07/13/21   [provider]  potassium chloride (KLOR-CON) 10 MEQ tablet Take 10 mEq by mouth daily. 06/25/20   [provider]  predniSONE  (STERAPRED UNI-PAK 21 TAB) 10 MG (21) TBPK tablet Take by mouth daily. Take 6 tabs by mouth daily for 1, then 5 tabs for 1 day, then 4 tabs for 1 day, then 3 tabs for 1 day, then 2 tabs for 1 day, then 1 tab for 1 day. 03/23/23   Brimage, Vondra, DO  sildenafil (VIAGRA) 100 MG tablet Take 100 mg by mouth as needed for erectile dysfunction. 11/26/22   [provider]    Family  History Family History  Problem Relation Age of Onset   Hypertension Mother    Heart failure Mother    Hypertension Father    Heart failure Father    Cancer Father     Social History Social History   Tobacco Use   Smoking status: Former    Types: Cigarettes    Passive exposure: Past   Smokeless tobacco: Never  Vaping Use   Vaping status: Never Used  Substance Use Topics   Alcohol use: Never   Drug use: Never     Allergies   Patient has no known allergies.   Review of Systems Review of Systems  Constitutional:  Negative for fever.  Musculoskeletal:  Positive for gait problem.  Skin:  Positive for color change and wound.  All other systems reviewed and are negative.    Physical Exam Triage Vital Signs ED Triage Vitals  Encounter Vitals Group     BP 12/23/23 1547 118/72     Girls Systolic BP Percentile --      Girls Diastolic BP Percentile --      Boys Systolic BP Percentile --      Boys Diastolic BP Percentile --      Pulse Rate 12/23/23 1547 76     Resp 12/23/23 1547 18     Temp 12/23/23 1547 97.7 F (36.5 C)     Temp Source 12/23/23 1547 Oral     SpO2 12/23/23 1547 93 %     Weight 12/23/23 1545 238 lb 15.7 oz (108.4 kg)     Height 12/23/23 1545 6' (1.829 m)     Head Circumference --      Peak Flow --      Pain Score 12/23/23 1544 4     Pain Loc --      Pain Education --      Exclude from Growth Chart --    No data found.  Updated Vital Signs BP 118/72 (BP Location: Right Arm)   Pulse 76   Temp 97.7 F (36.5 C) (Oral)   Resp 18   Ht 6' (1.829 m)   Wt 238 lb 15.7 oz (108.4 kg)   SpO2 93%   BMI 32.41 kg/m   Visual Acuity Right Eye Distance:   Left Eye Distance:   Bilateral Distance:    Right Eye Near:   Left Eye Near:    Bilateral Near:     Physical Exam Vitals and nursing note reviewed.  Constitutional:      General: He is not in acute distress.    Appearance: He is well-developed and well-groomed.  HENT:     Head:  Normocephalic and atraumatic.  Eyes:  Conjunctiva/sclera: Conjunctivae normal.  Cardiovascular:     Rate and Rhythm: Normal rate and regular rhythm.     Pulses:          Dorsalis pedis pulses are 2+ on the right side.     Heart sounds: Normal heart sounds. No murmur heard. Pulmonary:     Effort: Pulmonary effort is normal. No respiratory distress.     Breath sounds: Normal breath sounds.  Abdominal:     Palpations: Abdomen is soft.     Tenderness: There is no abdominal tenderness.  Musculoskeletal:        General: No swelling.     Cervical back: Neck supple.       Feet:  Feet:     Right foot:     Toenail Condition: Right toenails are abnormally thick and long. Fungal disease present. Skin:    General: Skin is warm and dry.     Capillary Refill: Capillary refill takes less than 2 seconds.     Findings: Erythema, signs of injury and wound present.  Neurological:     General: No focal deficit present.     Mental Status: He is alert and oriented to person, place, and time.     GCS: GCS eye subscore is 4. GCS verbal subscore is 5. GCS motor subscore is 6.  Psychiatric:        Attention and Perception: Attention normal.        Mood and Affect: Mood normal.        Speech: Speech normal.        Behavior: Behavior normal. Behavior is cooperative.         UC Treatments / Results  Labs (all labs ordered are listed, but only abnormal results are displayed) Labs Reviewed - No data to display  EKG   Radiology DG Tibia/Fibula Right Result Date: 12/23/2023 CLINICAL DATA:  Right lower leg pain after trauma EXAM: RIGHT TIBIA AND FIBULA - 2 VIEW COMPARISON:  None Available. FINDINGS: There is no evidence of fracture or other focal bone lesions. Diffuse subcutaneous soft tissue reticulations of the lower leg. IMPRESSION: 1. No acute fracture or dislocation. 2. Diffuse subcutaneous soft tissue reticulations of the lower leg, which may represent edema or cellulitis. Electronically  Signed   By: Limin  Xu M.D.   On: 12/23/2023 17:18    Procedures Procedures (including critical care time)  Medications Ordered in UC Medications - No data to display  Initial Impression / Assessment and Plan / UC Course  I have reviewed the triage vital signs and the nursing notes.  Pertinent labs & imaging results that were available during my care of the patient were reviewed by me and considered in my medical decision making (see chart for details).  Clinical Course as of 12/23/23 1733  Fri Dec 23, 2023  1611 Right tib/fib xray ordered for injury [JD]    Clinical Course User Index [JD] Amay Mijangos, Rilla, NP   Discussed exam findings and plan of care with patient, scripted Keflex , no purulent draiange noted, xray negative, strict go to ER precautions given, work note scripted.   Patient verbalized understanding to this provider.  Ddx: Right lower leg injury, cellulitis, fracture Final Clinical Impressions(s) / UC Diagnoses   Final diagnoses:  Injury of right lower extremity, initial encounter  Cellulitis of right lower extremity     Discharge Instructions      Your xray was negative for fracture. Take antibiotic as label directed for cellulitis. Rest,elevate right leg.  If  you have new or worsening symptoms, unable to take that medication, go to the emergency room for further evaluation     ED Prescriptions     Medication Sig Dispense Auth. Provider   cephALEXin  (KEFLEX ) 500 MG capsule Take 1 capsule (500 mg total) by mouth 4 (four) times daily for 7 days. 28 capsule Akul Leggette, NP      PDMP not reviewed this encounter.   Aminta Loose, NP 12/23/23 1733

## 2024-01-19 ENCOUNTER — Telehealth: Payer: Self-pay | Admitting: Urology

## 2024-01-19 DIAGNOSIS — R7989 Other specified abnormal findings of blood chemistry: Secondary | ICD-10-CM

## 2024-01-19 NOTE — Telephone Encounter (Signed)
 Pt called asking about a refill for Clomid  to be sent to [facility].  Pt hasn't been seen since 12/2022.  I scheduled appt. For January.  Patient wants to know if we could send in 1 month supply until he gets in for appt.  Pt also added to wait list.

## 2024-01-23 NOTE — Telephone Encounter (Signed)
 Notified patient that we can't send in medication refill. He has not been seen in a year and no labs.

## 2024-02-06 DIAGNOSIS — R7989 Other specified abnormal findings of blood chemistry: Secondary | ICD-10-CM

## 2024-02-07 ENCOUNTER — Telehealth: Payer: Self-pay | Admitting: Urology

## 2024-02-07 DIAGNOSIS — R7989 Other specified abnormal findings of blood chemistry: Secondary | ICD-10-CM

## 2024-02-07 MED ORDER — CLOMIPHENE CITRATE 50 MG PO TABS: 25.0000 mg | ORAL_TABLET | Freq: Every day | ORAL | 0 refills | Status: DC

## 2024-02-07 NOTE — Telephone Encounter (Signed)
 Pt has an appt this Thursday 1/8 @ 3:20.  He is out of Clomid  and asking if we would send a few days worth to [facility] to last until his appointment.

## 2024-02-08 NOTE — Progress Notes (Signed)
 "    02/09/2024 4:17 PM   Darina JONETTA Orange [birth year] [id number]  Referring provider: Inc, [facility] [address] [place],  [place] [address]  Urological history: 1.  Erectile dysfunction -Contributing factors of age, obesity, depression, hypertension, history of smoking, CAD and HLD -Testosterone  level pending -Failed sildenafil 100 mg on demand dosing -Failed Cialis  5 mg daily  2. Premature ejaculation  3. Hypogonadism - testosterone  level pending - hemoglobin/hematocrit pending  - hepatic panel pending - Clomid  50 mg, 1/2 tablet   4. BPH with LU TS  - PSA pending   Chief Complaint  Patient presents with   Low testosterone    HPI: HELMUT HENNON is a 64 y.o. male who presents today for follow up.    Previous records reviewed.   He reports good adherence to Clomid  50 mg, 1/2 tablet daily.  Denies new complaints of low libido, erectile dysfunction, fatigue, or mood changes.  No complaints of gynecomastia, visual changes, or thromboembolic symptoms.  Energy level, libido and overall sense of wellbeing being reported as stable/ improved compared to prior visit.    Testosterone  level pending   Hemoglobin/hematocrit pending   Liver enzymes pending   I PSS 4/1  He reports urinary frequency, urinary intermittency, a weak urinary stream, and nocturia x 1.  Patient denies any modifying or aggravating factors.  Patient denies any recent UTI's, gross hematuria, dysuria or suprapubic/flank pain.  Patient denies any fevers, chills, nausea or vomiting.    PSA pending   Serum creatinine (03/2023) 1.12  SHIM 10  He does not have confidence that he could get and keep an erection, his erections are not firm enough for penetrative intercourse, he has difficulty maintaining his erections, and he is not finding intercourse satisfactory for him.  Patient still having spontaneous erections.  He denies any pain or curvature with erections.  He is having difficulty obtaining  erections in the evenings.  PMH: Past Medical History:  Diagnosis Date   Heart murmur    Hypertension     Surgical History: Past Surgical History:  Procedure Laterality Date   TONSILLECTOMY      Home Medications:  Allergies as of 02/09/2024   No Known Allergies      Medication List        Accurate as of February 09, 2024  4:17 PM. If you have any questions, ask your nurse or doctor.          amLODipine 10 MG tablet Commonly known as: NORVASC Take 1 tablet by mouth daily.   aspirin  81 MG chewable tablet Chew 81 mg by mouth daily.   atorvastatin 80 MG tablet Commonly known as: LIPITOR Take 1 tablet by mouth daily.   citalopram 20 MG tablet Commonly known as: CELEXA Take 20 mg by mouth daily.   clomiPHENE  50 MG tablet Commonly known as: Clomid  Take 0.5 tablets (25 mg total) by mouth daily.   Eliquis 5 MG Tabs tablet Generic drug: apixaban Take 5 mg by mouth 2 (two) times daily.   FLUoxetine 20 MG capsule Commonly known as: PROZAC TAKE ONE CAPSULE BY MOUTH ONCE DAILY FOR MOOD   furosemide 20 MG tablet Commonly known as: LASIX Take 2 pills a day   gabapentin  100 MG capsule Commonly known as: Neurontin  Take 1 capsule (100 mg total) by mouth 3 (three) times daily.   ibuprofen  800 MG tablet Commonly known as: ADVIL    losartan 50 MG tablet Commonly known as: COZAAR Take 50 mg by mouth  daily.   losartan 25 MG tablet Commonly known as: COZAAR Take 25 mg by mouth daily.   methocarbamol  500 MG tablet Commonly known as: ROBAXIN  Take 1 tablet (500 mg total) by mouth every 8 (eight) hours as needed for muscle spasms.   metoprolol succinate 25 MG 24 hr tablet Commonly known as: TOPROL-XL Take 25 mg by mouth daily.   omeprazole 20 MG capsule Commonly known as: PRILOSEC   pantoprazole 40 MG tablet Commonly known as: PROTONIX Take 40 mg by mouth daily.   potassium chloride 10 MEQ tablet Commonly known as: KLOR-CON Take 10 mEq by mouth daily. You  may have different instructions for this medication elsewhere on this list. Ask your doctor how you should be taking this medication.   potassium chloride 10 MEQ tablet Commonly known as: KLOR-CON M Take 10 mEq by mouth daily. You may have different instructions for this medication elsewhere on this list. Ask your doctor how you should be taking this medication.   predniSONE  10 MG (21) Tbpk tablet Commonly known as: STERAPRED UNI-PAK 21 TAB Take by mouth daily. Take 6 tabs by mouth daily for 1, then 5 tabs for 1 day, then 4 tabs for 1 day, then 3 tabs for 1 day, then 2 tabs for 1 day, then 1 tab for 1 day.   rosuvastatin 10 MG tablet Commonly known as: CRESTOR Take 1 tablet by mouth daily.   sildenafil 100 MG tablet Commonly known as: VIAGRA Take 100 mg by mouth as needed for erectile dysfunction.   tadalafil  20 MG tablet Commonly known as: CIALIS  Take 20 mg by mouth daily.   Vitamin D-3 125 MCG (5000 UT) Tabs Take by mouth.        Allergies: No Known Allergies  Family History: Family History  Problem Relation Age of Onset   Hypertension Mother    Heart failure Mother    Hypertension Father    Heart failure Father    Cancer Father     Social History:  reports that he has quit smoking. His smoking use included cigarettes. He has been exposed to tobacco smoke. He has never used smokeless tobacco. He reports that he does not drink alcohol and does not use drugs.  ROS: Pertinent ROS in HPI  Physical Exam: BP 127/71   Pulse 61   Wt 230 lb (104.3 kg)   SpO2 95%   BMI 31.19 kg/m   Constitutional:  Well nourished. Alert and oriented, No acute distress. HEENT: [place] AT, moist mucus membranes.  Trachea midline Cardiovascular: No clubbing, cyanosis, or edema. Respiratory: Normal respiratory effort, no increased work of breathing. GU: No CVA tenderness.  No bladder fullness or masses.  Patient with circumcised phallus.  Urethral meatus is patent.  No penile discharge. No  penile lesions or rashes. Scrotum without lesions, cysts, rashes and/or edema.  Testicles are located scrotally bilaterally. No masses are appreciated in the testicles. Left and right epididymis are normal. Rectal: Patient with  normal sphincter tone. Anus and perineum without scarring or rashes. No rectal masses are appreciated. Prostate is approximately 50 + grams, entire gland not being able to palpate, no nodules are appreciated. Seminal vesicles were not palpated.  Neurologic: Grossly intact, no focal deficits, moving all 4 extremities. Psychiatric: Normal mood and affect.   Laboratory Data: See EPIC and HPI I have reviewed the labs.   Pertinent Imaging: N/A  Assessment & Plan:    1. Hypogonadism  -testosterone  levels pending  -H & H pending  -continue  Clomid  50 mg, 1/2 tablets daily   2. BPH with LUTS -PSA pending  -DRE benign -continue conservative management, avoiding bladder irritants and timed voiding's  3. Erectile dysfunction:    - He did mention that his wife was menopausal and she was having significant menopausal symptoms, so our advised him to encourage her to reach out to her gynecologist for further management - He will switch up and take the Clomid  at night and the Cialis  in the morning to see if that makes a difference regarding the timing of his erections     Return in about 1 year (around 02/08/2025) for PSA, LFT's, hemoglobin, hematocrit, testosterone , I PSS, SHIM .  These notes generated with voice recognition software. I apologize for typographical errors.  CLOTILDA HELON RIGGERS  [facility] [address]  [address] [place], [place] [address] [phone]  "

## 2024-02-09 ENCOUNTER — Other Ambulatory Visit: Payer: Self-pay

## 2024-02-09 ENCOUNTER — Ambulatory Visit (INDEPENDENT_AMBULATORY_CARE_PROVIDER_SITE_OTHER): Payer: Self-pay | Admitting: Urology

## 2024-02-09 VITALS — BP 127/71 | HR 61 | Wt 230.0 lb

## 2024-02-09 DIAGNOSIS — N529 Male erectile dysfunction, unspecified: Secondary | ICD-10-CM

## 2024-02-09 DIAGNOSIS — E291 Testicular hypofunction: Secondary | ICD-10-CM

## 2024-02-09 DIAGNOSIS — R7989 Other specified abnormal findings of blood chemistry: Secondary | ICD-10-CM

## 2024-02-09 DIAGNOSIS — N401 Enlarged prostate with lower urinary tract symptoms: Secondary | ICD-10-CM

## 2024-02-09 MED ORDER — TADALAFIL 20 MG PO TABS: 20.0000 mg | ORAL_TABLET | Freq: Every day | ORAL | 3 refills | Status: AC

## 2024-02-09 MED ORDER — CLOMIPHENE CITRATE 50 MG PO TABS: 25.0000 mg | ORAL_TABLET | Freq: Every day | ORAL | 3 refills | Status: AC

## 2024-02-10 ENCOUNTER — Ambulatory Visit: Payer: Self-pay | Admitting: Urology

## 2024-02-10 LAB — HEPATIC FUNCTION PANEL
ALT: 25 IU/L (ref 0–44)
AST: 27 IU/L (ref 0–40)
Albumin: 4.8 g/dL (ref 3.9–4.9)
Alkaline Phosphatase: 62 IU/L (ref 47–123)
Bilirubin Total: 0.4 mg/dL (ref 0.0–1.2)
Bilirubin, Direct: 0.14 mg/dL (ref 0.00–0.40)
Total Protein: 7.2 g/dL (ref 6.0–8.5)

## 2024-02-10 LAB — PSA: Prostate Specific Ag, Serum: 0.8 ng/mL (ref 0.0–4.0)

## 2024-02-10 LAB — HEMOGLOBIN AND HEMATOCRIT, BLOOD
Hematocrit: 47.3 % (ref 37.5–51.0)
Hemoglobin: 15.5 g/dL (ref 13.0–17.7)

## 2024-02-10 LAB — TESTOSTERONE: Testosterone: 852 ng/dL (ref 264–916)

## 2025-02-11 ENCOUNTER — Ambulatory Visit: Payer: Self-pay | Admitting: Urology
